# Patient Record
Sex: Female | Born: 1955 | Race: Black or African American | Hispanic: No | Smoking: Former smoker
Health system: Southern US, Community
[De-identification: ages and names within clinical notes are randomized; demographics above are authoritative.]

## PROBLEM LIST (undated history)

## (undated) ENCOUNTER — Emergency Department (HOSPITAL_COMMUNITY): Source: Home / Self Care

## (undated) DIAGNOSIS — J45909 Unspecified asthma, uncomplicated: Secondary | ICD-10-CM

## (undated) HISTORY — PX: FOOT SURGERY: SHX648

## (undated) HISTORY — PX: ABDOMINAL HYSTERECTOMY: SHX81

---

## 1998-08-04 ENCOUNTER — Observation Stay (HOSPITAL_COMMUNITY)
Admission: EM | Admit: 1998-08-04 | Discharge: 1998-08-06 | Payer: Self-pay | Source: Ambulatory Visit | Admitting: Emergency Medicine

## 1998-08-05 ENCOUNTER — Encounter: Payer: Self-pay | Admitting: Internal Medicine

## 2001-06-26 ENCOUNTER — Emergency Department (HOSPITAL_COMMUNITY): Admission: EM | Admit: 2001-06-26 | Discharge: 2001-06-27 | Payer: Self-pay | Admitting: Emergency Medicine

## 2002-12-23 ENCOUNTER — Emergency Department (HOSPITAL_COMMUNITY): Admission: EM | Admit: 2002-12-23 | Discharge: 2002-12-23 | Payer: Self-pay | Admitting: Emergency Medicine

## 2003-10-02 ENCOUNTER — Emergency Department (HOSPITAL_COMMUNITY): Admission: EM | Admit: 2003-10-02 | Discharge: 2003-10-02 | Payer: Self-pay | Admitting: Emergency Medicine

## 2008-01-11 ENCOUNTER — Encounter: Admission: RE | Admit: 2008-01-11 | Discharge: 2008-01-11 | Payer: Self-pay | Admitting: Gastroenterology

## 2009-02-14 ENCOUNTER — Encounter: Admission: RE | Admit: 2009-02-14 | Discharge: 2009-02-14 | Payer: Self-pay | Admitting: General Surgery

## 2009-02-17 ENCOUNTER — Ambulatory Visit (HOSPITAL_BASED_OUTPATIENT_CLINIC_OR_DEPARTMENT_OTHER): Admission: RE | Admit: 2009-02-17 | Discharge: 2009-02-17 | Payer: Self-pay | Admitting: General Surgery

## 2009-02-17 ENCOUNTER — Encounter (INDEPENDENT_AMBULATORY_CARE_PROVIDER_SITE_OTHER): Payer: Self-pay | Admitting: General Surgery

## 2010-06-29 ENCOUNTER — Emergency Department (HOSPITAL_COMMUNITY): Admission: EM | Admit: 2010-06-29 | Discharge: 2010-06-30 | Payer: Self-pay | Admitting: Emergency Medicine

## 2010-11-21 LAB — URINALYSIS, ROUTINE W REFLEX MICROSCOPIC
Bilirubin Urine: NEGATIVE
Glucose, UA: NEGATIVE mg/dL
Hgb urine dipstick: NEGATIVE
Ketones, ur: NEGATIVE mg/dL
Nitrite: NEGATIVE
Protein, ur: NEGATIVE mg/dL
Specific Gravity, Urine: 1.01 (ref 1.005–1.030)
Urobilinogen, UA: 0.2 mg/dL (ref 0.0–1.0)
pH: 6 (ref 5.0–8.0)

## 2010-12-17 LAB — BASIC METABOLIC PANEL
BUN: 9 mg/dL (ref 6–23)
CO2: 28 mEq/L (ref 19–32)
Calcium: 10.5 mg/dL (ref 8.4–10.5)
Chloride: 108 mEq/L (ref 96–112)
Creatinine, Ser: 0.73 mg/dL (ref 0.4–1.2)
GFR calc Af Amer: >60 mL/min (ref >60)
GFR calc non Af Amer: >60 mL/min (ref >60)
Glucose, Bld: 102 mg/dL — ABNORMAL HIGH (ref 70–99)
Potassium: 4 mEq/L (ref 3.5–5.1)
Sodium: 141 mEq/L (ref 135–145)

## 2010-12-17 LAB — DIFFERENTIAL
Basophils Absolute: 0 10*3/uL (ref 0.0–0.1)
Basophils Relative: 0 % (ref 0–1)
Eosinophils Absolute: 0.3 10*3/uL (ref 0.0–0.7)
Eosinophils Relative: 3 % (ref 0–5)
Lymphocytes Relative: 28 % (ref 12–46)
Lymphs Abs: 2.2 10*3/uL (ref 0.7–4.0)
Monocytes Absolute: 0.6 10*3/uL (ref 0.1–1.0)
Monocytes Relative: 8 % (ref 3–12)
Neutro Abs: 4.9 10*3/uL (ref 1.7–7.7)
Neutrophils Relative %: 61 % (ref 43–77)

## 2010-12-17 LAB — CBC
HCT: 35.8 % — ABNORMAL LOW (ref 36.0–46.0)
Hemoglobin: 11.6 g/dL — ABNORMAL LOW (ref 12.0–15.0)
MCHC: 32.3 g/dL (ref 30.0–36.0)
MCV: 71.4 fL — ABNORMAL LOW (ref 78.0–100.0)
Platelets: 370 10*3/uL (ref 150–400)
RBC: 5.01 MIL/uL (ref 3.87–5.11)
RDW: 15.1 % (ref 11.5–15.5)
WBC: 8 10*3/uL (ref 4.0–10.5)

## 2011-01-22 NOTE — Op Note (Signed)
Sandra Carr, Sandra Carr               ACCOUNT NO.:  [id number]   MEDICAL RECORD NO.:  [id number]          PATIENT TYPE:  AMB   LOCATION:  DSC                          FACILITY:  [facility]   PHYSICIAN:  Ollen Gross. Vernell Morgans, M.D. DATE OF BIRTH:  [birth year]   DATE OF PROCEDURE:  02/17/2009  DATE OF DISCHARGE:                               OPERATIVE REPORT   PREOPERATIVE DIAGNOSIS:  Cyst on the occipital scalp.   POSTOPERATIVE DIAGNOSIS:  Cyst on the occipital scalp.   PROCEDURE:  Excision of cyst from the scalp.   SURGEON:  Ollen Gross. Vernell Morgans, MD   ANESTHESIA:  General endotracheal.   PROCEDURE IN DETAIL:  After informed consent was obtained, the patient  was brought to the operating room, left in the supine position on a  stretcher.  After adequate induction of general anesthesia, the patient  was moved into a prone position on the operating room table and all  pressure points were padded.  Some of the hair was trimmed from her  posterior scalp overlying this large cyst.  The area in question was  then prepped with Betadine and draped in usual sterile manner.  The area  around the cyst was infiltrated with 0.25% Marcaine with epinephrine.  An elliptical incision of skin was made overlying the palpable mass.  This was oriented transversely.  The incision was carried through the  skin and into the subcutaneous tissue of the scalp sharply with a 15-  blade knife.  Once into the subcutaneous tissue, the cyst was excised  sharp by combination of blunt hemostat dissection and some sharp  dissection with electrocautery once the cyst was able to be maintained  intact without any rupture.  Once the cyst was completely removed, it  was sent to Pathology for further evaluation.  Hemostasis was achieved  using the Bovie electrocautery.  The wound was then closed with  interrupted 4-0 Monocryl subcuticular stitches.  A Dermabond dressing  was applied.  The patient tolerated the procedure well.  At the end  of  the case, all needle, sponge, and instrument counts were correct.  The  patient was then awaken and taken to the recovery room in stable  condition.      Ollen Gross. Vernell Morgans, M.D.  Electronically Signed     PST/MEDQ  D:  02/17/2009  T:  02/17/2009  Job:  [id number]

## 2014-07-01 ENCOUNTER — Ambulatory Visit
Admission: RE | Admit: 2014-07-01 | Discharge: 2014-07-01 | Disposition: A | Discharge status: Home / Self Care | Payer: 59 | Source: Ambulatory Visit | Attending: Family Medicine | Admitting: Family Medicine

## 2014-07-01 ENCOUNTER — Other Ambulatory Visit: Payer: Self-pay | Admitting: Family Medicine

## 2014-07-01 DIAGNOSIS — R0789 Other chest pain: Secondary | ICD-10-CM

## 2014-12-12 ENCOUNTER — Other Ambulatory Visit: Payer: Self-pay

## 2014-12-18 ENCOUNTER — Encounter (HOSPITAL_COMMUNITY): Payer: Self-pay | Admitting: Emergency Medicine

## 2014-12-18 ENCOUNTER — Emergency Department (HOSPITAL_COMMUNITY): Payer: 59

## 2014-12-18 ENCOUNTER — Emergency Department (HOSPITAL_COMMUNITY)
Admission: EM | Admit: 2014-12-18 | Discharge: 2014-12-18 | Disposition: A | Discharge status: Home / Self Care | Payer: 59 | Attending: Emergency Medicine | Admitting: Emergency Medicine

## 2014-12-18 DIAGNOSIS — M542 Cervicalgia: Secondary | ICD-10-CM | POA: Diagnosis present

## 2014-12-18 DIAGNOSIS — J45909 Unspecified asthma, uncomplicated: Secondary | ICD-10-CM | POA: Diagnosis not present

## 2014-12-18 DIAGNOSIS — Z7951 Long term (current) use of inhaled steroids: Secondary | ICD-10-CM | POA: Insufficient documentation

## 2014-12-18 DIAGNOSIS — Z79899 Other long term (current) drug therapy: Secondary | ICD-10-CM | POA: Diagnosis not present

## 2014-12-18 DIAGNOSIS — Z91040 Latex allergy status: Secondary | ICD-10-CM | POA: Insufficient documentation

## 2014-12-18 DIAGNOSIS — M791 Myalgia: Secondary | ICD-10-CM | POA: Insufficient documentation

## 2014-12-18 DIAGNOSIS — H9209 Otalgia, unspecified ear: Secondary | ICD-10-CM | POA: Insufficient documentation

## 2014-12-18 DIAGNOSIS — M436 Torticollis: Secondary | ICD-10-CM | POA: Insufficient documentation

## 2014-12-18 HISTORY — DX: Unspecified asthma, uncomplicated: J45.909

## 2014-12-18 MED ORDER — DEXAMETHASONE SODIUM PHOSPHATE 10 MG/ML IJ SOLN
5.0000 mg | Freq: Once | INTRAMUSCULAR | Status: AC
  Administered 2014-12-18: 5 mg via INTRAMUSCULAR
  Filled 2014-12-18: qty 1

## 2014-12-18 MED ORDER — KETOROLAC TROMETHAMINE 60 MG/2ML IM SOLN
60.0000 mg | Freq: Once | INTRAMUSCULAR | Status: AC
  Administered 2014-12-18: 60 mg via INTRAMUSCULAR
  Filled 2014-12-18: qty 2

## 2014-12-18 MED ORDER — METHOCARBAMOL 1000 MG/10ML IJ SOLN
500.0000 mg | Freq: Once | INTRAMUSCULAR | Status: AC
  Administered 2014-12-18: 500 mg via INTRAMUSCULAR
  Filled 2014-12-18: qty 5

## 2014-12-18 MED ORDER — METHOCARBAMOL 500 MG PO TABS: 500.0000 mg | ORAL_TABLET | Freq: Two times a day (BID) | ORAL | Status: DC

## 2014-12-18 MED ORDER — DIAZEPAM 5 MG/ML IJ SOLN
5.0000 mg | Freq: Once | INTRAMUSCULAR | Status: DC
  Filled 2014-12-18: qty 2

## 2014-12-18 NOTE — Discharge Instructions (Signed)
Return to the emergency room with worsening of symptoms, new symptoms or with symptoms that are concerning, , especially severe worsening of headache, visual or speech changes, weakness in face, arms or legs. RICE: Rest, Ice (three cycles of 20 mins on, 68mins off at least twice a day), compression/brace, elevation. Heating pad works well for back pain. Ibuprofen 400mg  (2 tablets 200mg ) every 5-6 hours for 3-5 days. Robaxin for severe pain. Do not operate machinery, drive or drink alcohol while taking narcotics or muscle relaxers. Call to make appointment as soon as possible for follow up with orthopedist. Follow up with PCP if symptoms worsen or are persistent. Read below information and follow recommendations. Torticollis, Acute You have suddenly (acutely) developed a twisted neck (torticollis). This is usually a self-limited condition. CAUSES  Acute torticollis may be caused by malposition, trauma or infection. Most commonly, acute torticollis is caused by sleeping in an awkward position. Torticollis may also be caused by the flexion, extension or twisting of the neck muscles beyond their normal position. Sometimes, the exact cause may not be known. SYMPTOMS  Usually, there is pain and limited movement of the neck. Your neck may twist to one side. DIAGNOSIS  The diagnosis is often made by physical examination. X-rays, CT scans or MRIs may be done if there is a history of trauma or concern of infection. TREATMENT  For a common, stiff neck that develops during sleep, treatment is focused on relaxing the contracted neck muscle. Medications (including shots) may be used to treat the problem. Most cases resolve in several days. Torticollis usually responds to conservative physical therapy. If left untreated, the shortened and spastic neck muscle can cause deformities in the face and neck. Rarely, surgery is required. HOME CARE INSTRUCTIONS   Use over-the-counter and prescription medications as  directed by your caregiver.  Do stretching exercises and massage the neck as directed by your caregiver.  Follow up with physical therapy if needed and as directed by your caregiver. SEEK IMMEDIATE MEDICAL CARE IF:   You develop difficulty breathing or noisy breathing (stridor).  You drool, develop trouble swallowing or have pain with swallowing.  You develop numbness or weakness in the hands or feet.  You have changes in speech or vision.  You have problems with urination or bowel movements.  You have difficulty walking.  You have a fever.  You have increased pain. MAKE SURE YOU:   Understand these instructions.  Will watch your condition.  Will get help right away if you are not doing well or get worse. Document Released: 08/23/2000 Document Revised: 11/18/2011 Document Reviewed: 10/04/2009 [facility] Patient Information 2015 [place], [place]. This information is not intended to replace advice given to you by your health care provider. Make sure you discuss any questions you have with your health care provider.

## 2014-12-18 NOTE — ED Notes (Addendum)
Pt states that she was looking through some papers on Friday and reached to put some on table to behind her and felt spasm in her neck and side of her face.  Pt states that spasms have gotten worse and all the way across her neck now.  Pt states that she took an ibuprofen this morning before church and helped a little bit then around 330pm she took an old flexeril which hasnt really helped the muscle pain.

## 2014-12-18 NOTE — ED Provider Notes (Signed)
CSN: [id number]     Arrival date & time 12/18/14  1728 History  This chart was scribed for non-physician provider Al Corpus, PA-C, working with Ernestina Patches, MD by Irene Pap, ED Scribe. This patient was seen in room WTR5/WTR5 and patient care was started at 5:55 PM.    Chief Complaint  Patient presents with  . Neck Pain  . Facial Pain   Patient is a 59 y.o. female presenting with neck pain. The history is provided by the patient. No language interpreter was used.  Neck Pain Associated symptoms: headaches   Associated symptoms: no fever, no numbness and no weakness    HPI Comments: Sandra Carr is a 59 y.o. female who presents to the Emergency Department complaining of bilateral neck and facial spasms with pain onset 2 days ago. She states that she was reaching for papers on Friday night when she felt pain in her neck that has been constant since. R worse then left. Patient reports that the pain now radiates across her neck and the spasms have worsened. She states that she has associated headache, and ear pain. Patient reports intermittent shooting pain down her arms, but is not frequent. She states that she took ibuprofen this morning to some relief and Flexeril 2 hours ago with no relief. She denies fever, visual changes, nausea, vomiting, numbness, weakness or tingling. Patient wears corrective lenses. No chest pain shortness of breath.   Past Medical History  Diagnosis Date  . Asthma    History reviewed. No pertinent past surgical history. No family history on file. History  Substance Use Topics  . Smoking status: Never Smoker   . Smokeless tobacco: Not on file  . Alcohol Use: No   OB History    No data available     Review of Systems  Constitutional: Negative for fever.  HENT: Positive for ear pain and trouble swallowing.   Eyes: Positive for pain. Negative for visual disturbance.  Musculoskeletal: Positive for myalgias and neck pain.  Neurological: Positive for  headaches. Negative for weakness and numbness.   Allergies  Peanut-containing drug products; Iohexol; Latex; and Shellfish allergy  Home Medications   Prior to Admission medications   Medication Sig Start Date End Date Taking? Authorizing Provider  albuterol (PROVENTIL HFA;VENTOLIN HFA) 108 (90 BASE) MCG/ACT inhaler Inhale 2 puffs into the lungs every 6 (six) hours as needed for wheezing or shortness of breath (wheezing).   Yes Historical Provider, MD  albuterol (PROVENTIL) (2.5 MG/3ML) 0.083% nebulizer solution Take 2.5 mg by nebulization every 6 (six) hours as needed for wheezing or shortness of breath (wheezing).   Yes Historical Provider, MD  amLODipine (NORVASC) 5 MG tablet Take 5 mg by mouth daily.   Yes Historical Provider, MD  cetirizine (ZYRTEC) 10 MG tablet Take 10 mg by mouth daily.   Yes Historical Provider, MD  mometasone-formoterol (DULERA) 100-5 MCG/ACT AERO Inhale 2 puffs into the lungs 2 (two) times daily.   Yes Historical Provider, MD  Multiple Vitamins-Minerals (MULTIVITAMIN & MINERAL PO) Take 1 tablet by mouth daily.   Yes Historical Provider, MD  zolpidem (AMBIEN CR) 12.5 MG CR tablet Take 6.25 mg by mouth at bedtime as needed for sleep (sleep).   Yes Historical Provider, MD  methocarbamol (ROBAXIN) 500 MG tablet Take 1 tablet (500 mg total) by mouth 2 (two) times daily. 12/18/14   Al Corpus, PA-C   BP 162/84 mmHg  Pulse 101  Temp(Src) 97.8 F (36.6 C) (Oral)  Resp 18  Ht 5'  4" (1.626 m)  Wt 156 lb (70.761 kg)  BMI 26.76 kg/m2  SpO2 100%  LMP 05/18/1993  Physical Exam  Constitutional: She appears well-developed and well-nourished. No distress.  HENT:  Head: Normocephalic and atraumatic.  Bilateral normal TMs  Eyes: Conjunctivae are normal. Right eye exhibits no discharge. Left eye exhibits no discharge.  Neck:  Significant muscle tightness bilaterally in sternocleidomastoid Neck: 45 range of motion bilaterally no meningismus. No stridor   Pulmonary/Chest: Effort normal. No respiratory distress.  Musculoskeletal:  No midline C-Spine bony tenderness  Neurological: She is alert. Coordination normal.  Skin: She is not diaphoretic.  Psychiatric: She has a normal mood and affect. Her behavior is normal.  Nursing note and vitals reviewed.   ED Course  Procedures (including critical care time) DIAGNOSTIC STUDIES: Oxygen Saturation is 100% on room air, normal by my interpretation.    COORDINATION OF CARE: 6:00 PM-Discussed treatment plan which includes muscle relaxer C-Spine x-ray with pt at bedside and pt agreed to plan.   Labs Review Labs Reviewed - No data to display  Imaging Review Dg Cervical Spine Complete  12/18/2014   CLINICAL DATA:  Bilateral neck pain for 2 days  EXAM: CERVICAL SPINE  4+ VIEWS  COMPARISON:  None.  FINDINGS: Normal alignment of the cervical spine. The vertebral body heights are well preserved. There is disc space narrowing and ventral endplate spurring at L4-6, C5-6 and C6-7. The facet joints are well-aligned. The neural foramina appear patent. No acute fracture or subluxation identified.  IMPRESSION: 1. No acute findings. 2. Cervical spondylosis.   Electronically Signed   By: Kerby Moors M.D.   On: 12/18/2014 19:06     EKG Interpretation None      MDM   Final diagnoses:  Torticollis, acute   Patient's pain managed in the ED. Patient with likely torticollis. X-ray without acute findings patient does have joint space narrowing at C4-7. Good alignment. Patient given prescription for Robaxin and discussed rice protocol as well as gentle stretching. Discussed strict return precautions and patient to follow-up with orthopedics.  Discussed return precautions with patient. Discussed all results and patient verbalizes understanding and agrees with plan.  I personally performed the services described in this documentation, which was scribed in my presence. The recorded information has been reviewed  and is accurate.   Al Corpus, PA-C 12/18/14 1954  Ernestina Patches, MD 12/19/14 [phone]

## 2016-01-25 ENCOUNTER — Ambulatory Visit (INDEPENDENT_AMBULATORY_CARE_PROVIDER_SITE_OTHER): Payer: 59

## 2016-01-25 DIAGNOSIS — R000 Tachycardia, unspecified: Secondary | ICD-10-CM | POA: Insufficient documentation

## 2016-09-30 DIAGNOSIS — I10 Essential (primary) hypertension: Secondary | ICD-10-CM | POA: Diagnosis not present

## 2016-09-30 DIAGNOSIS — R21 Rash and other nonspecific skin eruption: Secondary | ICD-10-CM | POA: Diagnosis not present

## 2016-10-04 DIAGNOSIS — R05 Cough: Secondary | ICD-10-CM | POA: Diagnosis not present

## 2016-10-28 DIAGNOSIS — I10 Essential (primary) hypertension: Secondary | ICD-10-CM | POA: Diagnosis not present

## 2016-10-28 DIAGNOSIS — Z8249 Family history of ischemic heart disease and other diseases of the circulatory system: Secondary | ICD-10-CM | POA: Diagnosis not present

## 2016-11-27 DIAGNOSIS — J4541 Moderate persistent asthma with (acute) exacerbation: Secondary | ICD-10-CM | POA: Diagnosis not present

## 2016-12-09 DIAGNOSIS — Z01419 Encounter for gynecological examination (general) (routine) without abnormal findings: Secondary | ICD-10-CM | POA: Diagnosis not present

## 2016-12-09 DIAGNOSIS — Z6824 Body mass index (BMI) 24.0-24.9, adult: Secondary | ICD-10-CM | POA: Diagnosis not present

## 2016-12-16 DIAGNOSIS — I10 Essential (primary) hypertension: Secondary | ICD-10-CM | POA: Diagnosis not present

## 2016-12-16 DIAGNOSIS — J45901 Unspecified asthma with (acute) exacerbation: Secondary | ICD-10-CM | POA: Diagnosis not present

## 2016-12-18 DIAGNOSIS — R05 Cough: Secondary | ICD-10-CM | POA: Diagnosis not present

## 2016-12-18 DIAGNOSIS — J209 Acute bronchitis, unspecified: Secondary | ICD-10-CM | POA: Diagnosis not present

## 2016-12-30 DIAGNOSIS — J209 Acute bronchitis, unspecified: Secondary | ICD-10-CM | POA: Diagnosis not present

## 2016-12-30 DIAGNOSIS — I10 Essential (primary) hypertension: Secondary | ICD-10-CM | POA: Diagnosis not present

## 2016-12-30 DIAGNOSIS — R739 Hyperglycemia, unspecified: Secondary | ICD-10-CM | POA: Diagnosis not present

## 2016-12-30 DIAGNOSIS — Z20828 Contact with and (suspected) exposure to other viral communicable diseases: Secondary | ICD-10-CM | POA: Diagnosis not present

## 2017-01-13 DIAGNOSIS — I10 Essential (primary) hypertension: Secondary | ICD-10-CM | POA: Diagnosis not present

## 2017-01-13 DIAGNOSIS — E559 Vitamin D deficiency, unspecified: Secondary | ICD-10-CM | POA: Diagnosis not present

## 2017-01-13 DIAGNOSIS — J45909 Unspecified asthma, uncomplicated: Secondary | ICD-10-CM | POA: Diagnosis not present

## 2017-01-23 DIAGNOSIS — H5213 Myopia, bilateral: Secondary | ICD-10-CM | POA: Diagnosis not present

## 2017-02-10 ENCOUNTER — Ambulatory Visit (HOSPITAL_COMMUNITY)
Admission: RE | Admit: 2017-02-10 | Discharge: 2017-02-10 | Disposition: A | Discharge status: Home / Self Care | Payer: 59 | Source: Ambulatory Visit | Attending: Family Medicine | Admitting: Family Medicine

## 2017-02-10 ENCOUNTER — Other Ambulatory Visit (HOSPITAL_COMMUNITY): Payer: Self-pay | Admitting: Family Medicine

## 2017-02-10 ENCOUNTER — Encounter (INDEPENDENT_AMBULATORY_CARE_PROVIDER_SITE_OTHER): Payer: Self-pay

## 2017-02-10 DIAGNOSIS — M79661 Pain in right lower leg: Secondary | ICD-10-CM | POA: Diagnosis not present

## 2017-02-10 DIAGNOSIS — R7303 Prediabetes: Secondary | ICD-10-CM | POA: Diagnosis not present

## 2017-02-10 DIAGNOSIS — M7989 Other specified soft tissue disorders: Secondary | ICD-10-CM | POA: Diagnosis not present

## 2017-02-10 DIAGNOSIS — R609 Edema, unspecified: Secondary | ICD-10-CM | POA: Diagnosis not present

## 2017-02-10 DIAGNOSIS — Z0000 Encounter for general adult medical examination without abnormal findings: Secondary | ICD-10-CM | POA: Diagnosis not present

## 2017-02-10 DIAGNOSIS — I10 Essential (primary) hypertension: Secondary | ICD-10-CM | POA: Diagnosis not present

## 2017-02-10 DIAGNOSIS — Z13220 Encounter for screening for lipoid disorders: Secondary | ICD-10-CM | POA: Diagnosis not present

## 2017-02-10 DIAGNOSIS — J45901 Unspecified asthma with (acute) exacerbation: Secondary | ICD-10-CM | POA: Diagnosis not present

## 2017-02-10 NOTE — Progress Notes (Signed)
VASCULAR LAB PRELIMINARY  PRELIMINARY  PRELIMINARY  PRELIMINARY  Right lower extremity venous duplex completed.    Preliminary report: Right:  No evidence of DVT, superficial thrombosis, or Baker's cyst.  Zuleica Seith, RVS 02/10/2017, 11:04 AM

## 2017-05-15 DIAGNOSIS — R7303 Prediabetes: Secondary | ICD-10-CM | POA: Diagnosis not present

## 2017-05-15 DIAGNOSIS — M1711 Unilateral primary osteoarthritis, right knee: Secondary | ICD-10-CM | POA: Diagnosis not present

## 2017-05-15 DIAGNOSIS — E559 Vitamin D deficiency, unspecified: Secondary | ICD-10-CM | POA: Diagnosis not present

## 2017-05-15 DIAGNOSIS — I10 Essential (primary) hypertension: Secondary | ICD-10-CM | POA: Diagnosis not present

## 2017-08-14 DIAGNOSIS — R7303 Prediabetes: Secondary | ICD-10-CM | POA: Diagnosis not present

## 2017-08-14 DIAGNOSIS — I10 Essential (primary) hypertension: Secondary | ICD-10-CM | POA: Diagnosis not present

## 2017-08-14 DIAGNOSIS — E559 Vitamin D deficiency, unspecified: Secondary | ICD-10-CM | POA: Diagnosis not present

## 2017-08-14 DIAGNOSIS — R002 Palpitations: Secondary | ICD-10-CM | POA: Diagnosis not present

## 2017-09-12 DIAGNOSIS — M25512 Pain in left shoulder: Secondary | ICD-10-CM | POA: Diagnosis not present

## 2017-09-12 DIAGNOSIS — M25532 Pain in left wrist: Secondary | ICD-10-CM | POA: Diagnosis not present

## 2017-09-12 DIAGNOSIS — M5412 Radiculopathy, cervical region: Secondary | ICD-10-CM | POA: Diagnosis not present

## 2017-09-25 DIAGNOSIS — I10 Essential (primary) hypertension: Secondary | ICD-10-CM | POA: Diagnosis not present

## 2017-09-25 DIAGNOSIS — Z8249 Family history of ischemic heart disease and other diseases of the circulatory system: Secondary | ICD-10-CM | POA: Diagnosis not present

## 2017-09-25 DIAGNOSIS — R002 Palpitations: Secondary | ICD-10-CM | POA: Diagnosis not present

## 2017-09-26 DIAGNOSIS — M5412 Radiculopathy, cervical region: Secondary | ICD-10-CM | POA: Diagnosis not present

## 2017-09-26 DIAGNOSIS — M25532 Pain in left wrist: Secondary | ICD-10-CM | POA: Diagnosis not present

## 2017-09-26 DIAGNOSIS — M25512 Pain in left shoulder: Secondary | ICD-10-CM | POA: Diagnosis not present

## 2017-10-02 DIAGNOSIS — H9193 Unspecified hearing loss, bilateral: Secondary | ICD-10-CM | POA: Diagnosis not present

## 2017-10-02 DIAGNOSIS — H9313 Tinnitus, bilateral: Secondary | ICD-10-CM | POA: Diagnosis not present

## 2017-10-02 DIAGNOSIS — R7303 Prediabetes: Secondary | ICD-10-CM | POA: Diagnosis not present

## 2017-10-06 DIAGNOSIS — R002 Palpitations: Secondary | ICD-10-CM | POA: Diagnosis not present

## 2017-10-06 DIAGNOSIS — R011 Cardiac murmur, unspecified: Secondary | ICD-10-CM | POA: Diagnosis not present

## 2017-10-09 DIAGNOSIS — Z0110 Encounter for examination of ears and hearing without abnormal findings: Secondary | ICD-10-CM | POA: Diagnosis not present

## 2017-10-09 DIAGNOSIS — H9313 Tinnitus, bilateral: Secondary | ICD-10-CM | POA: Diagnosis not present

## 2017-12-15 DIAGNOSIS — E559 Vitamin D deficiency, unspecified: Secondary | ICD-10-CM | POA: Diagnosis not present

## 2017-12-15 DIAGNOSIS — I10 Essential (primary) hypertension: Secondary | ICD-10-CM | POA: Diagnosis not present

## 2017-12-15 DIAGNOSIS — R7303 Prediabetes: Secondary | ICD-10-CM | POA: Diagnosis not present

## 2017-12-18 DIAGNOSIS — E559 Vitamin D deficiency, unspecified: Secondary | ICD-10-CM | POA: Diagnosis not present

## 2017-12-18 DIAGNOSIS — E210 Primary hyperparathyroidism: Secondary | ICD-10-CM | POA: Diagnosis not present

## 2017-12-18 DIAGNOSIS — I10 Essential (primary) hypertension: Secondary | ICD-10-CM | POA: Diagnosis not present

## 2017-12-29 DIAGNOSIS — Z01419 Encounter for gynecological examination (general) (routine) without abnormal findings: Secondary | ICD-10-CM | POA: Diagnosis not present

## 2017-12-30 ENCOUNTER — Other Ambulatory Visit: Payer: Self-pay | Admitting: Obstetrics and Gynecology

## 2017-12-30 DIAGNOSIS — R928 Other abnormal and inconclusive findings on diagnostic imaging of breast: Secondary | ICD-10-CM

## 2018-01-02 ENCOUNTER — Ambulatory Visit: Payer: 59

## 2018-01-02 ENCOUNTER — Ambulatory Visit
Admission: RE | Admit: 2018-01-02 | Discharge: 2018-01-02 | Disposition: A | Discharge status: Home / Self Care | Payer: 59 | Source: Ambulatory Visit | Attending: Obstetrics and Gynecology | Admitting: Obstetrics and Gynecology

## 2018-01-02 DIAGNOSIS — R928 Other abnormal and inconclusive findings on diagnostic imaging of breast: Secondary | ICD-10-CM | POA: Diagnosis not present

## 2018-01-30 DIAGNOSIS — H524 Presbyopia: Secondary | ICD-10-CM | POA: Diagnosis not present

## 2018-02-16 DIAGNOSIS — Z0000 Encounter for general adult medical examination without abnormal findings: Secondary | ICD-10-CM | POA: Diagnosis not present

## 2018-02-19 DIAGNOSIS — I10 Essential (primary) hypertension: Secondary | ICD-10-CM | POA: Diagnosis not present

## 2018-03-06 DIAGNOSIS — D126 Benign neoplasm of colon, unspecified: Secondary | ICD-10-CM | POA: Diagnosis not present

## 2018-03-06 DIAGNOSIS — Z1211 Encounter for screening for malignant neoplasm of colon: Secondary | ICD-10-CM | POA: Diagnosis not present

## 2018-07-22 DIAGNOSIS — R05 Cough: Secondary | ICD-10-CM | POA: Diagnosis not present

## 2018-08-24 DIAGNOSIS — R7303 Prediabetes: Secondary | ICD-10-CM | POA: Diagnosis not present

## 2018-08-24 DIAGNOSIS — I10 Essential (primary) hypertension: Secondary | ICD-10-CM | POA: Diagnosis not present

## 2018-08-24 DIAGNOSIS — E559 Vitamin D deficiency, unspecified: Secondary | ICD-10-CM | POA: Diagnosis not present

## 2018-12-31 DIAGNOSIS — Z01419 Encounter for gynecological examination (general) (routine) without abnormal findings: Secondary | ICD-10-CM | POA: Diagnosis not present

## 2018-12-31 DIAGNOSIS — Z130 Encounter for screening for diseases of the blood and blood-forming organs and certain disorders involving the immune mechanism: Secondary | ICD-10-CM | POA: Diagnosis not present

## 2018-12-31 DIAGNOSIS — Z6825 Body mass index (BMI) 25.0-25.9, adult: Secondary | ICD-10-CM | POA: Diagnosis not present

## 2019-02-28 ENCOUNTER — Other Ambulatory Visit: Payer: Self-pay | Admitting: *Deleted

## 2019-02-28 DIAGNOSIS — Z20822 Contact with and (suspected) exposure to covid-19: Secondary | ICD-10-CM

## 2019-03-08 LAB — NOVEL CORONAVIRUS, NAA: SARS-CoV-2, NAA: NOT DETECTED

## 2019-03-31 ENCOUNTER — Other Ambulatory Visit: Payer: Self-pay

## 2019-03-31 DIAGNOSIS — Z20822 Contact with and (suspected) exposure to covid-19: Secondary | ICD-10-CM

## 2019-04-04 LAB — NOVEL CORONAVIRUS, NAA: SARS-CoV-2, NAA: NOT DETECTED

## 2019-04-07 ENCOUNTER — Telehealth: Payer: Self-pay | Admitting: Family Medicine

## 2019-04-07 NOTE — Telephone Encounter (Signed)
Pt called back, made her aware COVID results are negative.  Copied from [place] [phone]. Topic: Quick Communication - Lab Results (Clinic Use ONLY) >> Apr 07, 2019  3:48 PM Marin Roberts, [place] wrote: Called patient to inform them of [place] lab results. When patient returns call, triage nurse may disclose results.

## 2019-08-23 ENCOUNTER — Other Ambulatory Visit: Payer: Self-pay

## 2019-08-23 DIAGNOSIS — Z20822 Contact with and (suspected) exposure to covid-19: Secondary | ICD-10-CM

## 2019-08-24 LAB — NOVEL CORONAVIRUS, NAA: SARS-CoV-2, NAA: NOT DETECTED

## 2019-08-25 ENCOUNTER — Telehealth: Payer: Self-pay | Admitting: Family Medicine

## 2019-08-25 NOTE — Telephone Encounter (Signed)
Negative COVID results given. Patient results "NOT Detected." Caller expressed understanding. ° °

## 2019-09-20 ENCOUNTER — Other Ambulatory Visit: Payer: Self-pay

## 2019-09-20 ENCOUNTER — Encounter (HOSPITAL_COMMUNITY): Payer: Self-pay | Admitting: Emergency Medicine

## 2019-09-20 ENCOUNTER — Emergency Department (HOSPITAL_COMMUNITY)
Admission: EM | Admit: 2019-09-20 | Discharge: 2019-09-20 | Disposition: A | Discharge status: Home / Self Care | Payer: Medicare Other | Attending: Emergency Medicine | Admitting: Emergency Medicine

## 2019-09-20 DIAGNOSIS — Z91040 Latex allergy status: Secondary | ICD-10-CM | POA: Diagnosis not present

## 2019-09-20 DIAGNOSIS — R112 Nausea with vomiting, unspecified: Secondary | ICD-10-CM | POA: Insufficient documentation

## 2019-09-20 DIAGNOSIS — Z91010 Allergy to peanuts: Secondary | ICD-10-CM | POA: Insufficient documentation

## 2019-09-20 DIAGNOSIS — M62838 Other muscle spasm: Secondary | ICD-10-CM | POA: Insufficient documentation

## 2019-09-20 DIAGNOSIS — Z79899 Other long term (current) drug therapy: Secondary | ICD-10-CM | POA: Insufficient documentation

## 2019-09-20 DIAGNOSIS — D509 Iron deficiency anemia, unspecified: Secondary | ICD-10-CM

## 2019-09-20 DIAGNOSIS — M542 Cervicalgia: Secondary | ICD-10-CM | POA: Diagnosis not present

## 2019-09-20 DIAGNOSIS — J45909 Unspecified asthma, uncomplicated: Secondary | ICD-10-CM | POA: Diagnosis not present

## 2019-09-20 DIAGNOSIS — M25511 Pain in right shoulder: Secondary | ICD-10-CM | POA: Diagnosis present

## 2019-09-20 LAB — CBC WITH DIFFERENTIAL/PLATELET
Abs Immature Granulocytes: 0.03 10*3/uL (ref 0.00–0.07)
Basophils Absolute: 0.1 10*3/uL (ref 0.0–0.1)
Basophils Relative: 1 %
Eosinophils Absolute: 0.4 10*3/uL (ref 0.0–0.5)
Eosinophils Relative: 4 %
HCT: 35.3 % — ABNORMAL LOW (ref 36.0–46.0)
Hemoglobin: 11.1 g/dL — ABNORMAL LOW (ref 12.0–15.0)
Immature Granulocytes: 0 %
Lymphocytes Relative: 30 %
Lymphs Abs: 3 10*3/uL (ref 0.7–4.0)
MCH: 21.8 pg — ABNORMAL LOW (ref 26.0–34.0)
MCHC: 31.4 g/dL (ref 30.0–36.0)
MCV: 69.4 fL — ABNORMAL LOW (ref 80.0–100.0)
Monocytes Absolute: 0.9 10*3/uL (ref 0.1–1.0)
Monocytes Relative: 9 %
Neutro Abs: 5.7 10*3/uL (ref 1.7–7.7)
Neutrophils Relative %: 56 %
Platelets: 536 10*3/uL — ABNORMAL HIGH (ref 150–400)
RBC: 5.09 MIL/uL (ref 3.87–5.11)
RDW: 19.1 % — ABNORMAL HIGH (ref 11.5–15.5)
WBC: 10.1 10*3/uL (ref 4.0–10.5)
nRBC: 0 % (ref 0.0–0.2)

## 2019-09-20 LAB — BASIC METABOLIC PANEL
Anion gap: 11 (ref 5–15)
BUN: 7 mg/dL — ABNORMAL LOW (ref 8–23)
CO2: 23 mmol/L (ref 22–32)
Calcium: 9.4 mg/dL (ref 8.9–10.3)
Chloride: 106 mmol/L (ref 98–111)
Creatinine, Ser: 0.78 mg/dL (ref 0.44–1.00)
GFR calc Af Amer: >60 mL/min (ref >60)
GFR calc non Af Amer: >60 mL/min (ref >60)
Glucose, Bld: 126 mg/dL — ABNORMAL HIGH (ref 70–99)
Potassium: 3.8 mmol/L (ref 3.5–5.1)
Sodium: 140 mmol/L (ref 135–145)

## 2019-09-20 LAB — TROPONIN I (HIGH SENSITIVITY): Troponin I (High Sensitivity): <2 ng/L (ref <18)

## 2019-09-20 LAB — MAGNESIUM: Magnesium: 1.9 mg/dL (ref 1.7–2.4)

## 2019-09-20 MED ORDER — METHOCARBAMOL 1000 MG/10ML IJ SOLN
1000.0000 mg | Freq: Once | INTRAMUSCULAR | Status: AC
  Administered 2019-09-20: 05:00:00 1000 mg via INTRAVENOUS
  Filled 2019-09-20: qty 10

## 2019-09-20 MED ORDER — KETOROLAC TROMETHAMINE 30 MG/ML IJ SOLN
15.0000 mg | Freq: Once | INTRAMUSCULAR | Status: AC
  Administered 2019-09-20: 04:00:00 15 mg via INTRAVENOUS
  Filled 2019-09-20: qty 1

## 2019-09-20 MED ORDER — NAPROXEN 375 MG PO TABS: 375.0000 mg | ORAL_TABLET | Freq: Two times a day (BID) | ORAL | 0 refills | Status: DC

## 2019-09-20 MED ORDER — ONDANSETRON HCL 4 MG/2ML IJ SOLN
4.0000 mg | Freq: Once | INTRAMUSCULAR | Status: AC
  Administered 2019-09-20: 4 mg via INTRAVENOUS
  Filled 2019-09-20: qty 2

## 2019-09-20 MED ORDER — MORPHINE SULFATE (PF) 4 MG/ML IV SOLN
4.0000 mg | Freq: Once | INTRAVENOUS | Status: AC
  Administered 2019-09-20: 4 mg via INTRAVENOUS
  Filled 2019-09-20: qty 1

## 2019-09-20 MED ORDER — ORPHENADRINE CITRATE ER 100 MG PO TB12: 100.0000 mg | ORAL_TABLET | Freq: Two times a day (BID) | ORAL | 0 refills | Status: DC

## 2019-09-20 MED ORDER — OXYCODONE-ACETAMINOPHEN 5-325 MG PO TABS: 1.0000 | ORAL_TABLET | ORAL | 0 refills | Status: DC | PRN

## 2019-09-20 NOTE — ED Provider Notes (Signed)
[facility] EMERGENCY DEPARTMENT Provider Note   CSN: [id number] Arrival date & time: 09/20/19  [id number]   History Chief Complaint  Patient presents with  . Shoulder Pain    Sandra Carr is a 64 y.o. female.  The history is provided by the patient.  Shoulder Pain He has history of asthma and hypertension and noted onset about 10 PM pain and muscle spasm in the right side of her neck and shoulder.  She has been unable to get comfortable.  Every time she moves, she feels the muscles grabbing her.  She currently rates pain at 10/10.  There has been some nausea and she has vomited once.  She denies dyspnea.  She denies doing any unusual activity.  She has never had anything like this before.  She did try taking a potassium supplement which did not seem to give any relief.  Past Medical History:  Diagnosis Date  . Asthma     Patient Active Problem List   Diagnosis Date Noted  . Tachycardia 01/25/2016    No past surgical history on file.   OB History   No obstetric history on file.     Family History  Problem Relation Age of Onset  . Breast cancer Cousin     Social History   Tobacco Use  . Smoking status: Never Smoker  Substance Use Topics  . Alcohol use: No  . Drug use: Not on file    Home Medications Prior to Admission medications   Medication Sig Start Date End Date Taking? Authorizing Provider  albuterol (PROVENTIL HFA;VENTOLIN HFA) 108 (90 BASE) MCG/ACT inhaler Inhale 2 puffs into the lungs every 6 (six) hours as needed for wheezing or shortness of breath (wheezing).    [provider]  albuterol (PROVENTIL) (2.5 MG/3ML) 0.083% nebulizer solution Take 2.5 mg by nebulization every 6 (six) hours as needed for wheezing or shortness of breath (wheezing).    [provider]  amLODipine (NORVASC) 5 MG tablet Take 5 mg by mouth daily.    [provider]  cetirizine (ZYRTEC) 10 MG tablet Take 10 mg by mouth daily.    [provider]  methocarbamol (ROBAXIN) 500 MG tablet Take 1 tablet (500 mg total) by mouth 2 (two) times daily. 12/18/14   Al Corpus, PA-C  mometasone-formoterol (DULERA) 100-5 MCG/ACT AERO Inhale 2 puffs into the lungs 2 (two) times daily.    [provider]  Multiple Vitamins-Minerals (MULTIVITAMIN & MINERAL PO) Take 1 tablet by mouth daily.    [provider]  zolpidem (AMBIEN CR) 12.5 MG CR tablet Take 6.25 mg by mouth at bedtime as needed for sleep (sleep).    [provider]    Allergies    Peanut-containing drug products, Iohexol, Latex, and Shellfish allergy  Review of Systems   Review of Systems  All other systems reviewed and are negative.   Physical Exam Updated Vital Signs BP 133/85 (BP Location: Left Arm)   Pulse 97   Temp 98.4 F (36.9 C) (Oral)   Resp (!) 30   LMP 05/18/1993   SpO2 100%   Physical Exam Vitals and nursing note reviewed.   64 year old female, appears uncomfortable, but is in no acute distress. Vital signs are significant for rapid respiratory rate. Oxygen saturation is 100%, which is normal. Head is normocephalic and atraumatic. PERRLA, EOMI. Oropharynx is clear. Neck is nontender and supple without adenopathy or JVD. Back is nontender and there is no CVA tenderness. Lungs  are clear without rales, wheezes, or rhonchi. Chest is nontender. Heart has regular rate and rhythm without murmur. Abdomen is soft, flat, nontender without masses or hepatosplenomegaly and peristalsis is normoactive. Extremities: There is significant tenderness palpation over the right deltoid muscle.  There is pain on passive movement of the right shoulder but there is no tenderness over the shoulder joint itself.  There is no warmth or erythema.  Distal neurovascular exam is intact. Skin is warm and dry without rash. Neurologic: Mental status is normal, cranial nerves are intact, there are no motor or sensory deficits.  ED Results /  Procedures / Treatments   Labs (all labs ordered are listed, but only abnormal results are displayed) Labs Reviewed  BASIC METABOLIC PANEL - Abnormal; Notable for the following components:      Result Value   Glucose, Bld 126 (*)    BUN 7 (*)    All other components within normal limits  CBC WITH DIFFERENTIAL/PLATELET - Abnormal; Notable for the following components:   Hemoglobin 11.1 (*)    HCT 35.3 (*)    MCV 69.4 (*)    MCH 21.8 (*)    RDW 19.1 (*)    Platelets 536 (*)    All other components within normal limits  MAGNESIUM  TROPONIN I (HIGH SENSITIVITY)  TROPONIN I (HIGH SENSITIVITY)    EKG EKG Interpretation  Date/Time:  Monday September 20 2019 06:09:23 EST Ventricular Rate:  79 PR Interval:    QRS Duration: 92 QT Interval:  372 QTC Calculation: 427 R Axis:   -22 Text Interpretation: Sinus rhythm Borderline left axis deviation Borderline T wave abnormalities When compared with ECG of 02/14/2009, No significant change was found Confirmed by Delora Fuel ([id number]) on 09/20/2019 6:10:36 AM   Procedures Procedures   Medications Ordered in ED Medications  methocarbamol (ROBAXIN) 1,000 mg in dextrose 5 % 100 mL IVPB (0 mg Intravenous Stopped 09/20/19 0544)  ondansetron (ZOFRAN) injection 4 mg (4 mg Intravenous Given 09/20/19 0414)  ketorolac (TORADOL) 30 MG/ML injection 15 mg (15 mg Intravenous Given 09/20/19 0414)  morphine 4 MG/ML injection 4 mg (4 mg Intravenous Given 09/20/19 0414)    ED Course  I have reviewed the triage vital signs and the nursing notes.  Pertinent labs & imaging results that were available during my care of the patient were reviewed by me and considered in my medical decision making (see chart for details).  MDM Rules/Calculators/A&P Muscle spasms in the right shoulder.  Will check ECG to make sure there is no a cardiac etiology.  Old records are reviewed and she does have a prior ED visit for torticollis.  She will be given IV ketorolac,  methocarbamol, ondansetron and a small dose of morphine as well.  ECG shows  No acute changes. Labs are significant only for mild microcytic anemia.  She had significant relief with above-noted treatment and is discharged with prescriptions for orphenadrine, naproxen, oxycodone-acetaminophen.  Prior to prescribing oxycodone acetaminophen, her record on the [place] controlled substance reporting website was reviewed and she has had very limited narcotic prescriptions in the past.  Final Clinical Impression(s) / ED Diagnoses Final diagnoses:  Muscle spasm of left shoulder area  Microcytic anemia    Rx / DC Orders ED Discharge Orders         Ordered    naproxen (NAPROSYN) 375 MG tablet  2 times daily     09/20/19 0553    orphenadrine (NORFLEX) 100 MG tablet  2 times daily  09/20/19 0553    oxyCODONE-acetaminophen (PERCOCET) 5-325 MG tablet  Every 4 hours PRN     09/20/19 0000000           Delora Fuel, [place] [id number] [phone]

## 2019-09-20 NOTE — Discharge Instructions (Addendum)
Apply ice as needed.  Take acetaminophen as needed for mild to moderate pain. Take oxycodone-acetaminophen as needed for more severe pain.  Return is symptoms are getting worse.

## 2019-09-20 NOTE — ED Triage Notes (Addendum)
Patient reports right shoulder joint pain radiating to right neck and upper chest onset last night, pain increases with movement /changing positions , unable to lift right arm due to pain , denies injury or fall , no fever or chills .

## 2019-10-18 ENCOUNTER — Other Ambulatory Visit: Payer: Self-pay | Admitting: *Deleted

## 2019-10-18 DIAGNOSIS — Z20822 Contact with and (suspected) exposure to covid-19: Secondary | ICD-10-CM

## 2019-10-19 LAB — NOVEL CORONAVIRUS, NAA: SARS-CoV-2, NAA: NOT DETECTED

## 2019-11-09 ENCOUNTER — Other Ambulatory Visit (HOSPITAL_COMMUNITY): Payer: Self-pay | Admitting: Family Medicine

## 2019-11-09 ENCOUNTER — Ambulatory Visit (HOSPITAL_COMMUNITY)
Admission: RE | Admit: 2019-11-09 | Discharge: 2019-11-09 | Disposition: A | Discharge status: Home / Self Care | Payer: Medicare Other | Source: Ambulatory Visit | Attending: Family Medicine | Admitting: Family Medicine

## 2019-11-09 ENCOUNTER — Other Ambulatory Visit: Payer: Self-pay

## 2019-11-09 DIAGNOSIS — M79604 Pain in right leg: Secondary | ICD-10-CM

## 2019-11-09 DIAGNOSIS — M7989 Other specified soft tissue disorders: Secondary | ICD-10-CM

## 2019-11-09 NOTE — Progress Notes (Signed)
Right lower extremity venous duplex has been completed. Preliminary results can be found in CV Proc through chart review.  Results were given to Dr. Jacelyn Grip.  11/09/19 4:40 PM Sandra Carr RVT

## 2019-11-16 DIAGNOSIS — M25561 Pain in right knee: Secondary | ICD-10-CM | POA: Insufficient documentation

## 2019-11-29 DIAGNOSIS — M545 Low back pain, unspecified: Secondary | ICD-10-CM | POA: Insufficient documentation

## 2020-01-10 ENCOUNTER — Other Ambulatory Visit: Payer: Self-pay | Admitting: Obstetrics and Gynecology

## 2020-01-10 DIAGNOSIS — R928 Other abnormal and inconclusive findings on diagnostic imaging of breast: Secondary | ICD-10-CM

## 2020-01-18 ENCOUNTER — Ambulatory Visit
Admission: RE | Admit: 2020-01-18 | Discharge: 2020-01-18 | Disposition: A | Discharge status: Home / Self Care | Payer: Medicare Other | Source: Ambulatory Visit | Attending: Obstetrics and Gynecology | Admitting: Obstetrics and Gynecology

## 2020-01-18 ENCOUNTER — Other Ambulatory Visit: Payer: Self-pay

## 2020-01-18 DIAGNOSIS — R928 Other abnormal and inconclusive findings on diagnostic imaging of breast: Secondary | ICD-10-CM

## 2020-04-04 ENCOUNTER — Other Ambulatory Visit: Payer: Self-pay

## 2020-04-04 ENCOUNTER — Ambulatory Visit: Payer: Medicare Other | Admitting: Podiatry

## 2020-04-04 DIAGNOSIS — L600 Ingrowing nail: Secondary | ICD-10-CM | POA: Diagnosis not present

## 2020-04-04 DIAGNOSIS — M79604 Pain in right leg: Secondary | ICD-10-CM

## 2020-04-04 NOTE — Patient Instructions (Signed)

## 2020-04-05 NOTE — Progress Notes (Signed)
Subjective:   Patient ID: Sandra Carr, female   DOB: 64 y.o.   MRN: [id number]   HPI 64 year old female presents the office today for concerns of a possible ingrown toenail.  She states that she gets pain to her toe that goes up her leg and is only at nighttime.  She has no pain to the toenail itself otherwise that she denies any swelling or redness or drainage.  She has no pain today currently.  She states that she has seen her primary care physician for the leg pain.   Review of Systems  All other systems reviewed and are negative.  Past Medical History:  Diagnosis Date  . Asthma     No past surgical history on file.   Current Outpatient Medications:  .  albuterol (PROVENTIL HFA;VENTOLIN HFA) 108 (90 BASE) MCG/ACT inhaler, Inhale 2 puffs into the lungs every 6 (six) hours as needed for wheezing or shortness of breath (wheezing)., Disp: , Rfl:  .  albuterol (PROVENTIL) (2.5 MG/3ML) 0.083% nebulizer solution, Take 2.5 mg by nebulization every 6 (six) hours as needed for wheezing or shortness of breath (wheezing)., Disp: , Rfl:  .  amLODipine (NORVASC) 5 MG tablet, Take 5 mg by mouth daily., Disp: , Rfl:  .  Ascorbic Acid (VITAMIN C PO), Take 1 tablet by mouth daily., Disp: , Rfl:  .  cholecalciferol (VITAMIN D3) 25 MCG (1000 UT) tablet, Take 1,000 Units by mouth daily., Disp: , Rfl:  .  Multiple Vitamin (MULTIVITAMIN WITH MINERALS) TABS tablet, Take 1 tablet by mouth daily., Disp: , Rfl:  .  naproxen (NAPROSYN) 375 MG tablet, Take 1 tablet (375 mg total) by mouth 2 (two) times daily., Disp: 30 tablet, Rfl: 0 .  orphenadrine (NORFLEX) 100 MG tablet, Take 1 tablet (100 mg total) by mouth 2 (two) times daily., Disp: 30 tablet, Rfl: 0 .  oxyCODONE-acetaminophen (PERCOCET) 5-325 MG tablet, Take 1 tablet by mouth every 4 (four) hours as needed for moderate pain., Disp: 10 tablet, Rfl: 0 .  zolpidem (AMBIEN CR) 12.5 MG CR tablet, Take 6.25 mg by mouth at bedtime as needed for sleep (sleep).,  Disp: , Rfl:   Allergies  Allergen Reactions  . Peanut-Containing Drug Products Shortness Of Breath  . Iohexol      Code: HIVES, Desc: patient claims hive from iv contrast. she drank omnipaque laced water oral contrast before informing anyone of an allergy (no problem from the oral at this time). no record of any enhancable procedures done in our system., Onset Date: [id number]   . Latex Swelling  . Shellfish Allergy Other (See Comments)    Mouth and Throat Swelling.         Objective:  Physical Exam  General: AAO x3, NAD  Dermatological: Mild incurvation of the distal aspects of bilateral hallux toenails of the medial lateral aspects of the nail.  There is no edema, erythema, drainage or pus or signs of infection there is no pain.  There is no open lesions.  Vascular: Dorsalis Pedis artery and Posterior Tibial artery pedal pulses are 2/4 bilateral with immedate capillary fill time. There is no pain with calf compression, swelling, warmth, erythema.   Neruologic: Grossly intact via light touch bilateral. Vibratory intact via tuning fork bilateral. Negative tinel sign.   Musculoskeletal: There is no tenderness palpation of the nail there is no pain to the leg today.  Muscular strength 5/5 in all groups tested bilateral.  Gait: Unassisted, Nonantalgic.       Assessment:  Ingrown toenail with right leg neuritis    Plan:  -Treatment options discussed including all alternatives, risks, and complications -Etiology of symptoms were discussed -Not convinced that the toenail self is causing the pain that should not be causing leg pain.  This is coming more from her other nerve issues.  I did debride the nails with any complications or bleeding.  Discussed with her partial nail removal which is at this time previously and she states it came right back and she is hesitant to do it again.  Recommend Epson salt soaks as well as antibiotic ointment daily. -ABI performed in the office today  left was 1.3 right 1.27  Return in about 6 weeks (around 05/16/2020).  Trula Slade DPM

## 2020-05-03 ENCOUNTER — Other Ambulatory Visit: Payer: Self-pay

## 2020-05-03 ENCOUNTER — Other Ambulatory Visit: Payer: Medicare Other

## 2020-05-03 DIAGNOSIS — Z20822 Contact with and (suspected) exposure to covid-19: Secondary | ICD-10-CM

## 2020-05-04 LAB — SARS-COV-2, NAA 2 DAY TAT

## 2020-05-04 LAB — NOVEL CORONAVIRUS, NAA: SARS-CoV-2, NAA: NOT DETECTED

## 2020-05-17 ENCOUNTER — Other Ambulatory Visit: Payer: Medicare Other

## 2020-05-17 ENCOUNTER — Other Ambulatory Visit: Payer: Self-pay

## 2020-05-17 DIAGNOSIS — Z20822 Contact with and (suspected) exposure to covid-19: Secondary | ICD-10-CM

## 2020-05-19 LAB — NOVEL CORONAVIRUS, NAA: SARS-CoV-2, NAA: NOT DETECTED

## 2020-05-19 LAB — SARS-COV-2, NAA 2 DAY TAT

## 2020-05-22 ENCOUNTER — Other Ambulatory Visit: Payer: Self-pay

## 2020-05-22 ENCOUNTER — Ambulatory Visit: Payer: Medicare Other | Admitting: Podiatry

## 2020-05-22 DIAGNOSIS — L600 Ingrowing nail: Secondary | ICD-10-CM

## 2020-05-22 DIAGNOSIS — B351 Tinea unguium: Secondary | ICD-10-CM

## 2020-05-22 DIAGNOSIS — M722 Plantar fascial fibromatosis: Secondary | ICD-10-CM | POA: Diagnosis not present

## 2020-05-22 NOTE — Patient Instructions (Addendum)
Look at getting a different shoe such as Marco Collie can use fungi-nail on the toenails  Plantar Fasciitis (Heel Spur Syndrome) with Rehab The plantar fascia is a fibrous, ligament-like, soft-tissue structure that spans the bottom of the foot. Plantar fasciitis is a condition that causes pain in the foot due to inflammation of the tissue. SYMPTOMS   Pain and tenderness on the underneath side of the foot.  Pain that worsens with standing or walking. CAUSES  Plantar fasciitis is caused by irritation and injury to the plantar fascia on the underneath side of the foot. Common mechanisms of injury include:  Direct trauma to bottom of the foot.  Damage to a small nerve that runs under the foot where the main fascia attaches to the heel bone.  Stress placed on the plantar fascia due to bone spurs. RISK INCREASES WITH:   Activities that place stress on the plantar fascia (running, jumping, pivoting, or cutting).  Poor strength and flexibility.  Improperly fitted shoes.  Tight calf muscles.  Flat feet.  Failure to warm-up properly before activity.  Obesity. PREVENTION  Warm up and stretch properly before activity.  Allow for adequate recovery between workouts.  Maintain physical fitness:  Strength, flexibility, and endurance.  Cardiovascular fitness.  Maintain a health body weight.  Avoid stress on the plantar fascia.  Wear properly fitted shoes, including arch supports for individuals who have flat feet.  PROGNOSIS  If treated properly, then the symptoms of plantar fasciitis usually resolve without surgery. However, occasionally surgery is necessary.  RELATED COMPLICATIONS   Recurrent symptoms that may result in a chronic condition.  Problems of the lower back that are caused by compensating for the injury, such as limping.  Pain or weakness of the foot during push-off following surgery.  Chronic inflammation, scarring, and partial or complete fascia tear,  occurring more often from repeated injections.  TREATMENT  Treatment initially involves the use of ice and medication to help reduce pain and inflammation. The use of strengthening and stretching exercises may help reduce pain with activity, especially stretches of the Achilles tendon. These exercises may be performed at home or with a therapist. Your caregiver may recommend that you use heel cups of arch supports to help reduce stress on the plantar fascia. Occasionally, corticosteroid injections are given to reduce inflammation. If symptoms persist for greater than 6 months despite non-surgical (conservative), then surgery may be recommended.   MEDICATION   If pain medication is necessary, then nonsteroidal anti-inflammatory medications, such as aspirin and ibuprofen, or other minor pain relievers, such as acetaminophen, are often recommended.  Do not take pain medication within 7 days before surgery.  Prescription pain relievers may be given if deemed necessary by your caregiver. Use only as directed and only as much as you need.  Corticosteroid injections may be given by your caregiver. These injections should be reserved for the most serious cases, because they may only be given a certain number of times.  HEAT AND COLD  Cold treatment (icing) relieves pain and reduces inflammation. Cold treatment should be applied for 10 to 15 minutes every 2 to 3 hours for inflammation and pain and immediately after any activity that aggravates your symptoms. Use ice packs or massage the area with a piece of ice (ice massage).  Heat treatment may be used prior to performing the stretching and strengthening activities prescribed by your caregiver, physical therapist, or athletic trainer. Use a heat pack or soak the injury in warm water.  SEEK IMMEDIATE MEDICAL CARE IF:  Treatment seems to offer no benefit, or the condition worsens.  Any medications produce adverse side effects.  EXERCISES- RANGE OF  MOTION (ROM) AND STRETCHING EXERCISES - Plantar Fasciitis (Heel Spur Syndrome) These exercises may help you when beginning to rehabilitate your injury. Your symptoms may resolve with or without further involvement from your physician, physical therapist or athletic trainer. While completing these exercises, remember:   Restoring tissue flexibility helps normal motion to return to the joints. This allows healthier, less painful movement and activity.  An effective stretch should be held for at least 30 seconds.  A stretch should never be painful. You should only feel a gentle lengthening or release in the stretched tissue.  RANGE OF MOTION - Toe Extension, Flexion  Sit with your right / left leg crossed over your opposite knee.  Grasp your toes and gently pull them back toward the top of your foot. You should feel a stretch on the bottom of your toes and/or foot.  Hold this stretch for 10 seconds.  Now, gently pull your toes toward the bottom of your foot. You should feel a stretch on the top of your toes and or foot.  Hold this stretch for 10 seconds. Repeat  times. Complete this stretch 3 times per day.   RANGE OF MOTION - Ankle Dorsiflexion, Active Assisted  Remove shoes and sit on a chair that is preferably not on a carpeted surface.  Place right / left foot under knee. Extend your opposite leg for support.  Keeping your heel down, slide your right / left foot back toward the chair until you feel a stretch at your ankle or calf. If you do not feel a stretch, slide your bottom forward to the edge of the chair, while still keeping your heel down.  Hold this stretch for 10 seconds. Repeat 3 times. Complete this stretch 2 times per day.   STRETCH  Gastroc, Standing  Place hands on wall.  Extend right / left leg, keeping the front knee somewhat bent.  Slightly point your toes inward on your back foot.  Keeping your right / left heel on the floor and your knee straight, shift  your weight toward the wall, not allowing your back to arch.  You should feel a gentle stretch in the right / left calf. Hold this position for 10 seconds. Repeat 3 times. Complete this stretch 2 times per day.  STRETCH  Soleus, Standing  Place hands on wall.  Extend right / left leg, keeping the other knee somewhat bent.  Slightly point your toes inward on your back foot.  Keep your right / left heel on the floor, bend your back knee, and slightly shift your weight over the back leg so that you feel a gentle stretch deep in your back calf.  Hold this position for 10 seconds. Repeat 3 times. Complete this stretch 2 times per day.  STRETCH  Gastrocsoleus, Standing  Note: This exercise can place a lot of stress on your foot and ankle. Please complete this exercise only if specifically instructed by your caregiver.   Place the ball of your right / left foot on a step, keeping your other foot firmly on the same step.  Hold on to the wall or a rail for balance.  Slowly lift your other foot, allowing your body weight to press your heel down over the edge of the step.  You should feel a stretch in your right / left calf.  Hold this position for 10 seconds.  Repeat this exercise with a slight bend in your right / left knee. Repeat 3 times. Complete this stretch 2 times per day.   STRENGTHENING EXERCISES - Plantar Fasciitis (Heel Spur Syndrome)  These exercises may help you when beginning to rehabilitate your injury. They may resolve your symptoms with or without further involvement from your physician, physical therapist or athletic trainer. While completing these exercises, remember:   Muscles can gain both the endurance and the strength needed for everyday activities through controlled exercises.  Complete these exercises as instructed by your physician, physical therapist or athletic trainer. Progress the resistance and repetitions only as guided.  STRENGTH - Towel Curls  Sit in  a chair positioned on a non-carpeted surface.  Place your foot on a towel, keeping your heel on the floor.  Pull the towel toward your heel by only curling your toes. Keep your heel on the floor. Repeat 3 times. Complete this exercise 2 times per day.  STRENGTH - Ankle Inversion  Secure one end of a rubber exercise band/tubing to a fixed object (table, pole). Loop the other end around your foot just before your toes.  Place your fists between your knees. This will focus your strengthening at your ankle.  Slowly, pull your big toe up and in, making sure the band/tubing is positioned to resist the entire motion.  Hold this position for 10 seconds.  Have your muscles resist the band/tubing as it slowly pulls your foot back to the starting position. Repeat 3 times. Complete this exercises 2 times per day.  Document Released: 08/26/2005 Document Revised: 11/18/2011 Document Reviewed: 12/08/2008 [facility] Patient Information 2014 [place], [place].

## 2020-05-23 NOTE — Progress Notes (Signed)
Subjective: 64 year old female presents the office today for concerns still of some recurrence of ingrown toenail, discomfort to right great toe.  She states that she previously had any of the lesions came back she had to have the nail removed again.  Also she still reports that she is getting pain from her hip down to her foot describing aching sensation.  At the end of the appointment she also reported she was having some discomfort in the bottom of the heel.  She denies any recent injury or trauma.  This is worse in the morning.  It is better with activity.  Denies any systemic complaints such as fevers, chills, nausea, vomiting. No acute changes since last appointment, and no other complaints at this time.   Objective: AAO x3, NAD DP/PT pulses palpable bilaterally, CRT less than 3 seconds On the right hallux toenail there is incurvation present the distal medial aspect with mild tenderness palpation.  There is no edema, erythema any signs of infection noted today.   Nails appear to be hypertrophic, dystrophic with brown discoloration. There is minimal tenderness palpation on plantar medial tubercle of the calcaneus with insertion of plantar fascia.  Backslash appears to be intact.  No pain about compression of calcaneus or Achilles tendon.  Equinus is present.  MMT 5/5. No pain with calf compression, swelling, warmth, erythema  Assessment: 64 year old female with ingrown toenail, plantar fasciitis; right leg pain  Plan: -All treatment options discussed with the patient including all alternatives, risks, complications.  -In regards to ingrown toenail discussed partial nail avulsion which she was followed up on this.  Sharply debrided the nail with any complications or bleeding.  Discussed treatment options for nail fungus.  Start of oral medication.  She is instilling topical Fungi-Nail. -The heel pain discussed stretching, icing exercises daily.  We discussed shoe modifications and also inserts  for shoes if needed.  If no improvement will get x-rays next appointment. -We discussed possible neurology evaluation for her right leg pain however recommend her to contact her primary care physician. -Patient encouraged to call the office with any questions, concerns, change in symptoms.   Trula Slade DPM

## 2020-07-08 ENCOUNTER — Ambulatory Visit: Payer: Medicare Other | Attending: Internal Medicine

## 2020-07-08 DIAGNOSIS — Z23 Encounter for immunization: Secondary | ICD-10-CM

## 2020-07-08 NOTE — Progress Notes (Signed)
   Covid-19 Vaccination Clinic  Name:  Sandra Carr    MRN: [id number] DOB: 11-Jun-1956  07/08/2020  Sandra Carr was observed post Covid-19 immunization for 15 minutes without incident. She was provided with Vaccine Information Sheet and instruction to access the V-Safe system.   Sandra Carr was instructed to call 911 with any severe reactions post vaccine: Marland Kitchen Difficulty breathing  . Swelling of face and throat  . A fast heartbeat  . A bad rash all over body  . Dizziness and weakness

## 2020-08-22 ENCOUNTER — Ambulatory Visit: Payer: Medicare Other | Admitting: Podiatry

## 2020-08-22 ENCOUNTER — Other Ambulatory Visit: Payer: Self-pay

## 2020-08-22 DIAGNOSIS — L600 Ingrowing nail: Secondary | ICD-10-CM

## 2020-08-22 DIAGNOSIS — M79604 Pain in right leg: Secondary | ICD-10-CM

## 2020-08-24 NOTE — Progress Notes (Signed)
Subjective: 64 year old female presents the office today for follow evaluation of ingrown toenail right big toe, lateral aspect.  She is the area still sore but no drainage or pus or swelling or redness.  In general she denies any other foot pain at this time.  She has no other concerns. Denies any systemic complaints such as fevers, chills, nausea, vomiting. No acute changes since last appointment, and no other complaints at this time.   Objective: AAO x3, NAD DP/PT pulses palpable bilaterally, CRT less than 3 seconds Incurvation present on the lateral aspect of right hallux toenail the distal aspect.  There is no edema, erythema, drainage or pus or any signs of infection noted today. No open lesions.  No other areas of discomfort identified bilaterally.  No pain with calf compression, swelling, warmth, erythema  Assessment: Ingrown toenail right hallux, without infection  Plan: -All treatment options discussed with the patient including all alternatives, risks, complications.  -Today discussed and recommended partial nail avulsion however she was ultimately treated intraoperatively the symptomatic portion of ingrown toenail and a slant back fashion without any complications or bleeding.  Recommend Epson salt soaks and monitoring signs or symptoms of infection, reoccurrence. -Patient encouraged to call the office with any questions, concerns, change in symptoms.   Trula Slade DPM

## 2020-10-26 ENCOUNTER — Other Ambulatory Visit: Payer: Self-pay | Admitting: Family Medicine

## 2020-10-26 ENCOUNTER — Other Ambulatory Visit (HOSPITAL_COMMUNITY): Payer: Self-pay | Admitting: Family Medicine

## 2020-10-26 DIAGNOSIS — R1013 Epigastric pain: Secondary | ICD-10-CM

## 2020-10-27 ENCOUNTER — Ambulatory Visit (HOSPITAL_COMMUNITY)
Admission: RE | Admit: 2020-10-27 | Discharge: 2020-10-27 | Disposition: A | Discharge status: Home / Self Care | Payer: Medicare Other | Source: Ambulatory Visit | Attending: Family Medicine | Admitting: Family Medicine

## 2020-10-27 ENCOUNTER — Other Ambulatory Visit: Payer: Self-pay

## 2020-10-27 DIAGNOSIS — R1013 Epigastric pain: Secondary | ICD-10-CM | POA: Diagnosis not present

## 2020-10-30 ENCOUNTER — Other Ambulatory Visit: Payer: Self-pay | Admitting: Gastroenterology

## 2020-10-30 ENCOUNTER — Other Ambulatory Visit: Payer: Self-pay | Admitting: Legal Medicine

## 2020-10-30 DIAGNOSIS — R1084 Generalized abdominal pain: Secondary | ICD-10-CM

## 2020-10-30 DIAGNOSIS — R1013 Epigastric pain: Secondary | ICD-10-CM

## 2020-11-06 ENCOUNTER — Telehealth: Payer: Self-pay

## 2020-11-06 MED ORDER — PREDNISONE 50 MG PO TABS: ORAL_TABLET | ORAL | 0 refills | Status: DC

## 2020-11-06 NOTE — Telephone Encounter (Signed)
Phone call to patient to review instructions for 13 hr prep for CT w/ contrast on 11/14/20 at 2pm. Prescription called into [facility]. Pt aware and verbalized understanding of instructions. Prescription: 3/8 at 1 am- 50mg  Prednisone 3/8 at 7 am- 50mg  Prednisone 3/8 at 1 pm - 50mg  Prednisone and 50mg  Benadryl

## 2020-11-14 ENCOUNTER — Other Ambulatory Visit: Payer: Self-pay

## 2020-11-14 ENCOUNTER — Ambulatory Visit
Admission: RE | Admit: 2020-11-14 | Discharge: 2020-11-14 | Disposition: A | Discharge status: Home / Self Care | Payer: Medicare Other | Source: Ambulatory Visit | Attending: Gastroenterology | Admitting: Gastroenterology

## 2020-11-14 DIAGNOSIS — R1084 Generalized abdominal pain: Secondary | ICD-10-CM

## 2020-11-14 DIAGNOSIS — R1013 Epigastric pain: Secondary | ICD-10-CM

## 2020-11-14 MED ORDER — IOPAMIDOL (ISOVUE-300) INJECTION 61%
100.0000 mL | Freq: Once | INTRAVENOUS | Status: AC | PRN
  Administered 2020-11-14: 100 mL via INTRAVENOUS

## 2020-12-04 ENCOUNTER — Telehealth: Payer: Self-pay

## 2020-12-04 ENCOUNTER — Encounter: Payer: Self-pay | Admitting: Cardiology

## 2020-12-04 ENCOUNTER — Ambulatory Visit: Payer: Medicare Other | Admitting: Cardiology

## 2020-12-04 ENCOUNTER — Other Ambulatory Visit: Payer: Self-pay

## 2020-12-04 VITALS — BP 135/84 | HR 77 | Temp 98.2°F | Resp 16 | Ht 64.0 in | Wt 148.0 lb

## 2020-12-04 DIAGNOSIS — R0789 Other chest pain: Secondary | ICD-10-CM

## 2020-12-04 DIAGNOSIS — R0600 Dyspnea, unspecified: Secondary | ICD-10-CM

## 2020-12-04 DIAGNOSIS — I10 Essential (primary) hypertension: Secondary | ICD-10-CM

## 2020-12-04 DIAGNOSIS — R0609 Other forms of dyspnea: Secondary | ICD-10-CM

## 2020-12-04 NOTE — Telephone Encounter (Signed)
error 

## 2020-12-04 NOTE — Progress Notes (Signed)
Primary Physician/Referring:  Vernie Shanks, MD  Patient ID: Sandra Carr, female    DOB: March 29, 1956, 65 y.o.   MRN: [id number]  Chief Complaint  Patient presents with  . substernal discomfort  . Coronary Artery Disease  . New Patient (Initial Visit)   HPI:    Sandra Carr  is a 65 y.o. African-American female with hypertension, hyperglycemia, remote reactive airway disease, since January 2022, has been having abdominal discomfort and frequent episodes of belching.  She has had extensive GI evaluation and eventually referred for cardiac evaluation in view of chest discomfort and dyspnea on exertion.  Patient states that she has been exercising regularly and has noticed gradual dyspnea and described as mild symptoms.  No PND or orthopnea.  No leg edema.  Chest discomfort is described as bandlike sensation across the chest, happens when she eats or when she belches and not necessarily related to activity.  Past Medical History:  Diagnosis Date  . Asthma    Past Surgical History:  Procedure Laterality Date  . ABDOMINAL HYSTERECTOMY    . FOOT SURGERY Left    Family History  Problem Relation Age of Onset  . Breast cancer Cousin   . Hypertension Father   . Heart failure Brother   . Heart disease Brother     Social History   Tobacco Use  . Smoking status: Former Smoker    Packs/day: 0.25    Years: 20.00    Pack years: 5.00    Types: Cigarettes    Quit date: 1998    Years since quitting: 24.2  . Smokeless tobacco: Never Used  Substance Use Topics  . Alcohol use: Yes    Comment: occ wine   Marital Status: Divorced  ROS  Review of Systems  Cardiovascular: Positive for chest pain and dyspnea on exertion. Negative for leg swelling.  Gastrointestinal: Positive for flatus and heartburn. Negative for melena.   Objective  Blood pressure 135/84, pulse 77, temperature 98.2 F (36.8 C), resp. rate 16, height _0  (1.626 m), weight 148 lb (67.1 kg), last menstrual period  05/18/1993, SpO2 96 %.  Vitals with BMI 12/04/2020 09/20/2019 09/20/2019  Height _1  - -  Weight 148 lbs - -  BMI 80.22 - -  Systolic 336 122 449  Diastolic 84 77 77  Pulse 77 80 80     Physical Exam Cardiovascular:     Rate and Rhythm: Normal rate and regular rhythm.     Pulses: Intact distal pulses.     Heart sounds: Murmur heard.   Early systolic murmur is present with a grade of 2/6 at the upper right sternal border. No gallop.   Pulmonary:     Effort: Pulmonary effort is normal.     Breath sounds: Normal breath sounds.  Abdominal:     General: Bowel sounds are normal.     Palpations: Abdomen is soft.  Musculoskeletal:     Cervical back: Neck supple.     Right lower leg: No edema.     Left lower leg: No edema.    Laboratory examination:   No results for input(s): NA, K, CL, CO2, GLUCOSE, BUN, CREATININE, CALCIUM, GFRNONAA, GFRAA in the last 8760 hours. CrCl cannot be calculated (Patient's most recent lab result is older than the maximum 21 days allowed.).  CMP Latest Ref Rng & Units 09/20/2019 02/14/2009  Glucose 70 - 99 mg/dL 126(H) 102(H)  BUN 8 - 23 mg/dL 7(L) 9  Creatinine 0.44 - 1.00 mg/dL 0.78 0.73  Sodium 135 - 145 mmol/L 140 141  Potassium 3.5 - 5.1 mmol/L 3.8 4.0  Chloride 98 - 111 mmol/L 106 108  CO2 22 - 32 mmol/L 23 28  Calcium 8.9 - 10.3 mg/dL 9.4 10.5   CBC Latest Ref Rng & Units 09/20/2019 02/14/2009  WBC 4.0 - 10.5 K/uL 10.1 8.0  Hemoglobin 12.0 - 15.0 g/dL 11.1(L) 11.6(L)  Hematocrit 36.0 - 46.0 % 35.3(L) 35.8(L)  Platelets 150 - 400 K/uL 536(H) 370     External labs:   Labs 11/09/2020:  Total cholesterol 158, triglycerides 70, HDL 61, LDL 84.  Amylase normal.  Hb 11.2/HCT 35.5, platelets 484, mildly elevated.  Serum glucose 59, BUN 11, creatinine 0.71, EGFR 82/100 mL, potassium 3.7.  CMP otherwise normal.  Vitamin D 46.0.  Medications and allergies   Allergies  Allergen Reactions  . Other Hives and Anaphylaxis  . Peanut-Containing  Drug Products Shortness Of Breath  . Iohexol      Code: HIVES, Desc: patient claims hive from iv contrast. she drank omnipaque laced water oral contrast before informing anyone of an allergy (no problem from the oral at this time). no record of any enhancable procedures done in our system., Onset Date: [id number]   . Latex Swelling  . Methocarbamol Swelling  . Shellfish Allergy Other (See Comments)    Mouth and Throat Swelling.  . Triamcinolone Other (See Comments)  . Hydrochlorothiazide Rash    Current Meds  Medication Sig  . albuterol (PROVENTIL HFA;VENTOLIN HFA) 108 (90 BASE) MCG/ACT inhaler Inhale 2 puffs into the lungs every 6 (six) hours as needed for wheezing or shortness of breath (wheezing).  Marland Kitchen albuterol (PROVENTIL) (2.5 MG/3ML) 0.083% nebulizer solution Take 2.5 mg by nebulization every 6 (six) hours as needed for wheezing or shortness of breath (wheezing).  Marland Kitchen amLODipine (NORVASC) 5 MG tablet Take 5 mg by mouth daily.  . Ascorbic Acid (VITAMIN C) 500 MG CHEW 1 tablet  . cholecalciferol (VITAMIN D3) 25 MCG (1000 UT) tablet Take 1,000 Units by mouth daily.  . clobetasol cream (TEMOVATE) 0.05 % clobetasol 0.05 % topical cream  APPLY A THIN LAYER TO THE AFFECTED AREA(S) BY TOPICAL ROUTE 2 TIMES PER DAY  . cyclobenzaprine (FLEXERIL) 10 MG tablet Take 10 mg by mouth 3 (three) times daily.  Marland Kitchen EPINEPHrine 0.3 mg/0.3 mL IJ SOAJ injection epinephrine 0.3 mg/0.3 mL injection, auto-injector  . Fluticasone-Salmeterol (ADVAIR) 250-50 MCG/DOSE AEPB SMARTSIG:By Mouth  . Hyoscyamine Sulfate SL 0.125 MG SUBL Place 1 tablet under the tongue as needed.  . Multiple Vitamins-Minerals (CENTRUM SILVER 50+WOMEN PO) Centrum Silver  . pantoprazole (PROTONIX) 40 MG tablet 1 tablet  . zolpidem (AMBIEN CR) 12.5 MG CR tablet Take 6.25 mg by mouth at bedtime as needed for sleep (sleep).     Radiology:   No results found.   Cardiac Studies:   None EKG:     EKG 12/04/2020: Normal sinus rhythm at rate of  70 bpm, leftward axis.  Otherwise normal EKG.    Assessment     ICD-10-CM   1. Atypical chest pain  R07.89 EKG 12-Lead    PCV ECHOCARDIOGRAM COMPLETE    PCV CARDIAC STRESS TEST  2. Dyspnea on exertion  R06.00 PCV ECHOCARDIOGRAM COMPLETE  3. Primary hypertension  I10       No orders of the defined types were placed in this encounter.  Orders Placed This Encounter  Procedures  . PCV CARDIAC STRESS TEST    Standing Status:   Future    Standing  Expiration Date:   02/03/2021  . EKG 12-Lead  . PCV ECHOCARDIOGRAM COMPLETE    Standing Status:   Future    Standing Expiration Date:   12/04/2021    Recommendations:   Dodie Parisi is a 65 y.o. African-American female with hypertension, hyperglycemia, remote reactive airway disease, since January 2022, has been having abdominal discomfort and frequent episodes of belching.  She has had extensive GI evaluation and eventually referred for cardiac evaluation in view of chest discomfort and dyspnea on exertion.  Her symptoms do not appear to suggest angina however in view of hypoglycemia, hypertension as risk factors, I would recommend a routine treadmill stress test in view of normal EKG.  I will also perform an echocardiogram in view of dyspnea on exertion.  I advised her to stop all the supplements to see if her GI symptoms would improve.  She has not had any asthmatic attacks in many years and would like to discontinue Advair as well.  Advised her that she should certainly follow-up with her PCP regarding this.  I would like to see her back in 4 weeks for follow-up.    Adrian Prows, MD, [facility] 12/04/2020, 1:53 PM Office: [phone]

## 2020-12-14 ENCOUNTER — Other Ambulatory Visit: Payer: Self-pay

## 2020-12-14 ENCOUNTER — Ambulatory Visit: Payer: Medicare Other

## 2020-12-14 DIAGNOSIS — R0600 Dyspnea, unspecified: Secondary | ICD-10-CM

## 2020-12-14 DIAGNOSIS — R0789 Other chest pain: Secondary | ICD-10-CM

## 2020-12-14 DIAGNOSIS — R0609 Other forms of dyspnea: Secondary | ICD-10-CM

## 2021-01-05 ENCOUNTER — Other Ambulatory Visit: Payer: Self-pay

## 2021-01-05 ENCOUNTER — Ambulatory Visit: Payer: Medicare Other

## 2021-01-05 DIAGNOSIS — R0789 Other chest pain: Secondary | ICD-10-CM

## 2021-01-06 NOTE — Progress Notes (Signed)
Normal GXT

## 2021-01-09 ENCOUNTER — Encounter: Payer: Self-pay | Admitting: Cardiology

## 2021-01-09 ENCOUNTER — Ambulatory Visit: Payer: Medicare Other | Admitting: Cardiology

## 2021-01-09 ENCOUNTER — Other Ambulatory Visit: Payer: Self-pay

## 2021-01-09 VITALS — BP 119/72 | HR 79 | Temp 98.2°F | Resp 17 | Ht 64.0 in | Wt 148.8 lb

## 2021-01-09 DIAGNOSIS — R0789 Other chest pain: Secondary | ICD-10-CM

## 2021-01-09 DIAGNOSIS — R0600 Dyspnea, unspecified: Secondary | ICD-10-CM

## 2021-01-09 DIAGNOSIS — R0609 Other forms of dyspnea: Secondary | ICD-10-CM

## 2021-01-09 NOTE — Progress Notes (Signed)
Primary Physician/Referring:  Vernie Shanks, MD  Patient ID: Sandra Carr, female    DOB: 07-Feb-1956, 65 y.o.   MRN: [id number]  Chief Complaint  Patient presents with  . Chest Pain  . Shortness of Breath  . Follow-up    4 WEEKS   HPI:    Sandra Carr  is a 65 y.o. African-American female with hypertension, hyperglycemia, remote reactive airway disease, since January 2022, has been having abdominal discomfort and frequent episodes of belching.  She has had extensive GI evaluation and eventually referred for cardiac evaluation in view of chest discomfort and dyspnea on exertion.  Patient states that she has been exercising regularly and has noticed gradual dyspnea and described as mild symptoms.  No PND or orthopnea.  No leg edema.  Chest discomfort is described as bandlike sensation across the chest, happens when she eats or when she belches and not necessarily related to activity.  She has had extensive GI evaluation, she recently had endoscopy and she is awaiting the results of the same.  I had seen her about 4 to 6 weeks ago and had recommended a routine treadmill stress test and also an echocardiogram and she now presents for follow-up.  States her symptoms of chest discomfort has improved.  She has continued to remain active and does not had any significant dyspnea, PND or orthopnea or leg edema.  Past Medical History:  Diagnosis Date  . Asthma    Past Surgical History:  Procedure Laterality Date  . ABDOMINAL HYSTERECTOMY    . FOOT SURGERY Left    Family History  Problem Relation Age of Onset  . Breast cancer Cousin   . Hypertension Mother   . Diabetes Mother   . Hypertension Father   . Heart failure Brother   . Heart disease Brother     Social History   Tobacco Use  . Smoking status: Former Smoker    Packs/day: 0.25    Years: 20.00    Pack years: 5.00    Types: Cigarettes    Quit date: 1998    Years since quitting: 24.3  . Smokeless tobacco: Never Used   Substance Use Topics  . Alcohol use: Yes    Comment: occ wine   Marital Status: Divorced  ROS  Review of Systems  Cardiovascular: Positive for chest pain and dyspnea on exertion. Negative for leg swelling.  Gastrointestinal: Positive for flatus and heartburn. Negative for melena.   Objective  Blood pressure 119/72, pulse 79, temperature 98.2 F (36.8 C), temperature source Temporal, resp. rate 17, height $RemoveBe'5\' 4"'RdgvLpYUX$  (1.626 m), weight 148 lb 12.8 oz (67.5 kg), last menstrual period 05/18/1993, SpO2 98 %.  Vitals with BMI 01/09/2021 12/04/2020 09/20/2019  Height $Remov'5\' 4"'IfaVRZ$  $Remove'5\' 4"'eyhqwpU$  -  Weight 148 lbs 13 oz 148 lbs -  BMI 58.85 02.77 -  Systolic 412 878 676  Diastolic 72 84 77  Pulse 79 77 80     Physical Exam Cardiovascular:     Rate and Rhythm: Normal rate and regular rhythm.     Pulses: Intact distal pulses.     Heart sounds: Murmur heard.   Early systolic murmur is present with a grade of 2/6 at the upper right sternal border. No gallop.   Pulmonary:     Effort: Pulmonary effort is normal.     Breath sounds: Normal breath sounds.  Abdominal:     General: Bowel sounds are normal.     Palpations: Abdomen is soft.  Musculoskeletal:  Cervical back: Neck supple.     Right lower leg: No edema.     Left lower leg: No edema.    Laboratory examination:   No results for input(s): NA, K, CL, CO2, GLUCOSE, BUN, CREATININE, CALCIUM, GFRNONAA, GFRAA in the last 8760 hours. CrCl cannot be calculated (Patient's most recent lab result is older than the maximum 21 days allowed.).  CMP Latest Ref Rng & Units 09/20/2019 02/14/2009  Glucose 70 - 99 mg/dL 126(H) 102(H)  BUN 8 - 23 mg/dL 7(L) 9  Creatinine 0.44 - 1.00 mg/dL 0.78 0.73  Sodium 135 - 145 mmol/L 140 141  Potassium 3.5 - 5.1 mmol/L 3.8 4.0  Chloride 98 - 111 mmol/L 106 108  CO2 22 - 32 mmol/L 23 28  Calcium 8.9 - 10.3 mg/dL 9.4 10.5   CBC Latest Ref Rng & Units 09/20/2019 02/14/2009  WBC 4.0 - 10.5 K/uL 10.1 8.0  Hemoglobin 12.0 - 15.0 g/dL  11.1(L) 11.6(L)  Hematocrit 36.0 - 46.0 % 35.3(L) 35.8(L)  Platelets 150 - 400 K/uL 536(H) 370     External labs:   Labs 11/09/2020:  Total cholesterol 158, triglycerides 70, HDL 61, LDL 84.  Amylase normal.  Hb 11.2/HCT 35.5, platelets 484, mildly elevated.   BUN 11, creatinine 0.71, EGFR 82/100 mL, potassium 3.7.  CMP otherwise normal.  Vitamin D 46.0. Serum glucose 88 mg, A1c 6.2%.  Medications and allergies   Allergies  Allergen Reactions  . Other Hives and Anaphylaxis  . Peanut-Containing Drug Products Shortness Of Breath  . Iohexol      Code: HIVES, Desc: patient claims hive from iv contrast. she drank omnipaque laced water oral contrast before informing anyone of an allergy (no problem from the oral at this time). no record of any enhancable procedures done in our system., Onset Date: [id number]   . Latex Swelling  . Methocarbamol Swelling  . Shellfish Allergy Other (See Comments)    Mouth and Throat Swelling.  . Triamcinolone Other (See Comments)  . Hydrochlorothiazide Rash    Current Meds  Medication Sig  . albuterol (PROVENTIL HFA;VENTOLIN HFA) 108 (90 BASE) MCG/ACT inhaler Inhale 2 puffs into the lungs every 6 (six) hours as needed for wheezing or shortness of breath (wheezing).  Marland Kitchen albuterol (PROVENTIL) (2.5 MG/3ML) 0.083% nebulizer solution Take 2.5 mg by nebulization every 6 (six) hours as needed for wheezing or shortness of breath (wheezing).  Marland Kitchen amLODipine (NORVASC) 5 MG tablet Take 5 mg by mouth daily.  . clobetasol cream (TEMOVATE) 1.66 % Apply 1 application topically as needed.  . cyclobenzaprine (FLEXERIL) 10 MG tablet Take 10 mg by mouth as needed.  Marland Kitchen EPINEPHrine 0.3 mg/0.3 mL IJ SOAJ injection epinephrine 0.3 mg/0.3 mL injection, auto-injector  . Hyoscyamine Sulfate SL 0.125 MG SUBL Place 1 tablet under the tongue as needed.  . mupirocin ointment (BACTROBAN) 2 % Apply 1 application topically as needed.  . pantoprazole (PROTONIX) 40 MG tablet 1 tablet   . zolpidem (AMBIEN CR) 12.5 MG CR tablet Take 6.25 mg by mouth at bedtime as needed for sleep (sleep).     Radiology:   No results found.   Cardiac Studies:   Exercise treadmill stress test 01/05/2021: Exercise treadmill stress test performed using Bruce protocol.  Patient reached 10.2 METS, and 106% of age predicted maximum heart rate.  Exercise capacity was excellent. No chest pain reported.  Normal heart rate and hemodynamic response. Stress EKG revealed no ischemic changes. Low risk study.  Echocardiogram 12/14/2020: Left ventricle cavity is normal  in size and wall thickness. Normal global wall motion. Normal LV systolic function with EF 65%. Normal diastolic filling pattern.  No significant valvular abnormality. No evidence of pulmonary hypertension. No significant change compared to previous study on 10/06/2017.   EKG:     EKG 12/04/2020: Normal sinus rhythm at rate of 70 bpm, leftward axis.  Otherwise normal EKG.    Assessment     ICD-10-CM   1. Atypical chest pain  R07.89   2. Dyspnea on exertion  R06.00       No orders of the defined types were placed in this encounter.  No orders of the defined types were placed in this encounter.   Recommendations:   Sandra Carr is a 65 y.o. African-American female with hypertension, hyperglycemia, remote reactive airway disease, since January 2022, has been having abdominal discomfort and frequent episodes of belching.  She has had extensive GI evaluation and eventually referred for cardiac evaluation in view of chest discomfort and dyspnea on exertion.  Her chest pain symptoms are clearly atypical and suggest a GI etiology however in view of hyperglycemia and hypertension and African-American heritage has risk factors, I performed routine treadmill stress testing which she has excellent exercise tolerance without ST-T wave changes of ischemia.  Low risk stress test.  Also echocardiogram does not reveal any significant structural  abnormality or aortic abnormality.  I simply reassured her, I will see her back on a as needed basis.    Adrian Prows, MD, FACC 01/09/2021, 4:08 PM Office: [phone]

## 2021-01-19 ENCOUNTER — Encounter (HOSPITAL_BASED_OUTPATIENT_CLINIC_OR_DEPARTMENT_OTHER): Payer: Self-pay | Admitting: *Deleted

## 2021-01-19 ENCOUNTER — Emergency Department (HOSPITAL_BASED_OUTPATIENT_CLINIC_OR_DEPARTMENT_OTHER): Payer: Medicare Other

## 2021-01-19 ENCOUNTER — Emergency Department (HOSPITAL_BASED_OUTPATIENT_CLINIC_OR_DEPARTMENT_OTHER)
Admission: EM | Admit: 2021-01-19 | Discharge: 2021-01-19 | Disposition: A | Discharge status: Home / Self Care | Payer: Medicare Other | Attending: Emergency Medicine | Admitting: Emergency Medicine

## 2021-01-19 ENCOUNTER — Other Ambulatory Visit: Payer: Self-pay

## 2021-01-19 DIAGNOSIS — Z20822 Contact with and (suspected) exposure to covid-19: Secondary | ICD-10-CM | POA: Diagnosis not present

## 2021-01-19 DIAGNOSIS — Z7951 Long term (current) use of inhaled steroids: Secondary | ICD-10-CM | POA: Diagnosis not present

## 2021-01-19 DIAGNOSIS — E876 Hypokalemia: Secondary | ICD-10-CM | POA: Diagnosis not present

## 2021-01-19 DIAGNOSIS — Z87891 Personal history of nicotine dependence: Secondary | ICD-10-CM | POA: Insufficient documentation

## 2021-01-19 DIAGNOSIS — J4521 Mild intermittent asthma with (acute) exacerbation: Secondary | ICD-10-CM | POA: Diagnosis not present

## 2021-01-19 DIAGNOSIS — Z91010 Allergy to peanuts: Secondary | ICD-10-CM | POA: Insufficient documentation

## 2021-01-19 DIAGNOSIS — Z91040 Latex allergy status: Secondary | ICD-10-CM | POA: Diagnosis not present

## 2021-01-19 DIAGNOSIS — R0602 Shortness of breath: Secondary | ICD-10-CM | POA: Diagnosis present

## 2021-01-19 LAB — CBC WITH DIFFERENTIAL/PLATELET
Abs Immature Granulocytes: 0.1 10*3/uL — ABNORMAL HIGH (ref 0.00–0.07)
Basophils Absolute: 0.1 10*3/uL (ref 0.0–0.1)
Basophils Relative: 1 %
Eosinophils Absolute: 0.6 10*3/uL — ABNORMAL HIGH (ref 0.0–0.5)
Eosinophils Relative: 4 %
HCT: 34.4 % — ABNORMAL LOW (ref 36.0–46.0)
Hemoglobin: 10.9 g/dL — ABNORMAL LOW (ref 12.0–15.0)
Immature Granulocytes: 1 %
Lymphocytes Relative: 39 %
Lymphs Abs: 5.2 10*3/uL — ABNORMAL HIGH (ref 0.7–4.0)
MCH: 21.5 pg — ABNORMAL LOW (ref 26.0–34.0)
MCHC: 31.7 g/dL (ref 30.0–36.0)
MCV: 67.7 fL — ABNORMAL LOW (ref 80.0–100.0)
Monocytes Absolute: 0.9 10*3/uL (ref 0.1–1.0)
Monocytes Relative: 7 %
Neutro Abs: 6.7 10*3/uL (ref 1.7–7.7)
Neutrophils Relative %: 48 %
Platelets: 482 10*3/uL — ABNORMAL HIGH (ref 150–400)
RBC: 5.08 MIL/uL (ref 3.87–5.11)
RDW: 18.6 % — ABNORMAL HIGH (ref 11.5–15.5)
WBC: 13.6 10*3/uL — ABNORMAL HIGH (ref 4.0–10.5)
nRBC: 0 % (ref 0.0–0.2)

## 2021-01-19 LAB — RESP PANEL BY RT-PCR (FLU A&B, COVID) ARPGX2
Influenza A by PCR: NEGATIVE
Influenza B by PCR: NEGATIVE
SARS Coronavirus 2 by RT PCR: NEGATIVE

## 2021-01-19 LAB — BASIC METABOLIC PANEL
Anion gap: 9 (ref 5–15)
BUN: 14 mg/dL (ref 8–23)
CO2: 24 mmol/L (ref 22–32)
Calcium: 9 mg/dL (ref 8.9–10.3)
Chloride: 107 mmol/L (ref 98–111)
Creatinine, Ser: 0.87 mg/dL (ref 0.44–1.00)
GFR, Estimated: >60 mL/min (ref >60)
Glucose, Bld: 133 mg/dL — ABNORMAL HIGH (ref 70–99)
Potassium: 2.7 mmol/L — CL (ref 3.5–5.1)
Sodium: 140 mmol/L (ref 135–145)

## 2021-01-19 LAB — MAGNESIUM: Magnesium: 1.8 mg/dL (ref 1.7–2.4)

## 2021-01-19 MED ORDER — METHYLPREDNISOLONE SODIUM SUCC 125 MG IJ SOLR
125.0000 mg | Freq: Once | INTRAMUSCULAR | Status: AC
  Administered 2021-01-19: 125 mg via INTRAVENOUS
  Filled 2021-01-19: qty 2

## 2021-01-19 MED ORDER — POTASSIUM CHLORIDE ER 20 MEQ PO TBCR: 20.0000 meq | EXTENDED_RELEASE_TABLET | Freq: Every day | ORAL | 0 refills | Status: DC

## 2021-01-19 MED ORDER — POTASSIUM CHLORIDE CRYS ER 20 MEQ PO TBCR
40.0000 meq | EXTENDED_RELEASE_TABLET | Freq: Once | ORAL | Status: AC
  Administered 2021-01-19: 40 meq via ORAL
  Filled 2021-01-19: qty 2

## 2021-01-19 MED ORDER — IPRATROPIUM-ALBUTEROL 0.5-2.5 (3) MG/3ML IN SOLN
3.0000 mL | Freq: Once | RESPIRATORY_TRACT | Status: AC
  Administered 2021-01-19: 3 mL via RESPIRATORY_TRACT
  Filled 2021-01-19: qty 3

## 2021-01-19 MED ORDER — BUDESONIDE 180 MCG/ACT IN AEPB
2.0000 | INHALATION_SPRAY | Freq: Every day | RESPIRATORY_TRACT | 0 refills | Status: DC
Start: 2021-01-19 — End: 2021-02-08

## 2021-01-19 NOTE — ED Triage Notes (Signed)
Asthma. Cough x 5 days. She was started on antibiotics and a tapered dose of steroids with no improvement.

## 2021-01-19 NOTE — ED Provider Notes (Signed)
[facility]PART[facility] Provider Note   CSN: [id number] Arrival date & time: 01/19/21  1601     History Chief Complaint  Patient presents with  . Asthma    Sandra Carr is a 65 y.o. female.  Pt presents to the ED today with sob and cough.  Pt has a hx of asthma and had a bad asthma attack on 5/7.  She was helping her daughter move and was spraying Lysol and bringing up a lot of dust.   She did see her Dr. Dwyane Dee at [facility] on 5/8 for the sob.  She was put on Doxy and Prednisone.  Pt has nebs at home that she's been using.  She does not feel like she is getting better.  She has been vaccinated + boosted for Covid.  No known sick exposures.  No f/c.        Past Medical History:  Diagnosis Date  . Asthma     Patient Active Problem List   Diagnosis Date Noted  . Low back pain 11/29/2019  . Pain in right knee 11/16/2019  . Subjective tinnitus, bilateral 10/09/2017  . Tachycardia 01/25/2016    Past Surgical History:  Procedure Laterality Date  . ABDOMINAL HYSTERECTOMY    . FOOT SURGERY Left      OB History   No obstetric history on file.     Family History  Problem Relation Age of Onset  . Breast cancer Cousin   . Hypertension Mother   . Diabetes Mother   . Hypertension Father   . Heart failure Brother   . Heart disease Brother     Social History   Tobacco Use  . Smoking status: Former Smoker    Packs/day: 0.25    Years: 20.00    Pack years: 5.00    Types: Cigarettes    Quit date: 1998    Years since quitting: 24.3  . Smokeless tobacco: Never Used  Vaping Use  . Vaping Use: Never used  Substance Use Topics  . Alcohol use: Yes    Comment: occ wine  . Drug use: Never    Home Medications Prior to Admission medications   Medication Sig Start Date End Date Taking? Authorizing Provider  budesonide (PULMICORT) 180 MCG/ACT inhaler Inhale 2 puffs into the lungs daily. 01/19/21  Yes [name], MD  potassium chloride 20 MEQ TBCR  Take 20 mEq by mouth daily. 01/19/21  Yes [name], MD  albuterol (PROVENTIL HFA;VENTOLIN HFA) 108 (90 BASE) MCG/ACT inhaler Inhale 2 puffs into the lungs every 6 (six) hours as needed for wheezing or shortness of breath (wheezing).    [provider]  albuterol (PROVENTIL) (2.5 MG/3ML) 0.083% nebulizer solution Take 2.5 mg by nebulization every 6 (six) hours as needed for wheezing or shortness of breath (wheezing).    [provider]  amLODipine (NORVASC) 5 MG tablet Take 5 mg by mouth daily.    [provider]  clobetasol cream (TEMOVATE) 0.93 % Apply 1 application topically as needed.    [provider]  cyclobenzaprine (FLEXERIL) 10 MG tablet Take 10 mg by mouth as needed. 06/12/20   [provider]  EPINEPHrine 0.3 mg/0.3 mL IJ SOAJ injection epinephrine 0.3 mg/0.3 mL injection, auto-injector    [provider]  Hyoscyamine Sulfate SL 0.125 MG SUBL Place 1 tablet under the tongue as needed. 11/28/20   [provider]  mupirocin ointment (BACTROBAN) 2 % Apply 1 application topically as needed.    [provider]  pantoprazole (PROTONIX) 40 MG tablet 1 tablet 11/28/20   [provider]  zolpidem (AMBIEN CR) 12.5 MG CR tablet Take 6.25 mg by mouth at bedtime as needed for sleep (sleep).    [provider]    Allergies    Other, Peanut-containing drug products, Iohexol, Latex, Methocarbamol, Shellfish allergy, Triamcinolone, and Hydrochlorothiazide  Review of Systems   Review of Systems  Respiratory: Positive for cough, shortness of breath and wheezing.   All other systems reviewed and are negative.   Physical Exam Updated Vital Signs BP 134/82   Pulse 81   Temp 98.4 F (36.9 C) (Oral)   Resp 18   Ht 5\' 4"  (1.626 m)   Wt 67.5 kg   LMP 05/18/1993   SpO2 100%   BMI 25.54 kg/m   Physical Exam Vitals and nursing note reviewed.  Constitutional:      Appearance: Normal appearance.  HENT:      Head: Normocephalic and atraumatic.     Right Ear: External ear normal.     Left Ear: External ear normal.     Nose: Nose normal.     Mouth/Throat:     Mouth: Mucous membranes are moist.     Pharynx: Oropharynx is clear.  Eyes:     Extraocular Movements: Extraocular movements intact.     Conjunctiva/sclera: Conjunctivae normal.     Pupils: Pupils are equal, round, and reactive to light.  Cardiovascular:     Rate and Rhythm: Normal rate and regular rhythm.     Pulses: Normal pulses.     Heart sounds: Normal heart sounds.  Pulmonary:     Breath sounds: Wheezing present.  Abdominal:     General: Abdomen is flat. Bowel sounds are normal.     Palpations: Abdomen is soft.  Musculoskeletal:        General: Normal range of motion.     Cervical back: Normal range of motion and neck supple.  Skin:    General: Skin is warm.     Capillary Refill: Capillary refill takes less than 2 seconds.  Neurological:     General: No focal deficit present.     Mental Status: She is alert and oriented to person, place, and time.  Psychiatric:        Mood and Affect: Mood normal.        Behavior: Behavior normal.        Thought Content: Thought content normal.        Judgment: Judgment normal.     ED Results / Procedures / Treatments   Labs (all labs ordered are listed, but only abnormal results are displayed) Labs Reviewed  BASIC METABOLIC PANEL - Abnormal; Notable for the following components:      Result Value   Potassium 2.7 (*)    Glucose, Bld 133 (*)    All other components within normal limits  CBC WITH DIFFERENTIAL/PLATELET - Abnormal; Notable for the following components:   WBC 13.6 (*)    Hemoglobin 10.9 (*)    HCT 34.4 (*)    MCV 67.7 (*)    MCH 21.5 (*)    RDW 18.6 (*)    Platelets 482 (*)    Lymphs Abs 5.2 (*)    Eosinophils Absolute 0.6 (*)    Abs Immature Granulocytes 0.10 (*)    All other components within normal limits  RESP PANEL BY RT-PCR (FLU A&B, COVID) [id number]   MAGNESIUM    EKG None  Radiology DG Chest Portable  1 View  Result Date: 01/19/2021 CLINICAL DATA:  Shortness of breath EXAM: PORTABLE CHEST 1 VIEW COMPARISON:  07/22/2018 FINDINGS: The heart size and mediastinal contours are within normal limits. Both lungs are clear. Surgical hardware in the cervical spine. IMPRESSION: No active disease. Electronically Signed   By: Donavan Foil M.D.   On: 01/19/2021 20:01    Procedures Procedures   Medications Ordered in ED Medications  ipratropium-albuterol (DUONEB) 0.5-2.5 (3) MG/3ML nebulizer solution 3 mL (3 mLs Nebulization Given 01/19/21 2011)  methylPREDNISolone sodium succinate (SOLU-MEDROL) 125 mg/2 mL injection 125 mg (125 mg Intravenous Given 01/19/21 1936)  potassium chloride SA (KLOR-CON) CR tablet 40 mEq (40 mEq Oral Given 01/19/21 2019)    ED Course  I have reviewed the triage vital signs and the nursing notes.  Pertinent labs & imaging results that were available during my care of the patient were reviewed by me and considered in my medical decision making (see chart for details).    MDM Rules/Calculators/A&P                          Covid neg.  She is feeling better after a duoneb.  She is hypokalemic with a K of 2.7.  She is given 40 meq orally here.  CXR neg.  Pt will be d/c with a pulmicort inhaler and instructed to return if worse.  F/u with pcp.  Reniya Mcclees was evaluated in Emergency Department on 01/19/2021 for the symptoms described in the history of present illness. She was evaluated in the context of the global COVID-19 pandemic, which necessitated consideration that the patient might be at risk for infection with the SARS-CoV-2 virus that causes COVID-19. Institutional protocols and algorithms that pertain to the evaluation of patients at risk for COVID-19 are in a state of rapid change based on information released by regulatory bodies including the CDC and federal and state organizations. These policies and algorithms  were followed during the patient's care in the ED.  Final Clinical Impression(s) / ED Diagnoses Final diagnoses:  Mild intermittent asthma with exacerbation  Hypokalemia    Rx / DC Orders ED Discharge Orders         Ordered    potassium chloride 20 MEQ TBCR  Daily        01/19/21 2106    budesonide (PULMICORT) 180 MCG/ACT inhaler  Daily        01/19/21 2106           [name], MD 01/19/21 2107

## 2021-02-06 ENCOUNTER — Other Ambulatory Visit: Payer: Self-pay

## 2021-02-06 ENCOUNTER — Emergency Department (HOSPITAL_COMMUNITY): Payer: Medicare Other

## 2021-02-06 ENCOUNTER — Inpatient Hospital Stay (HOSPITAL_COMMUNITY)
Admission: EM | Admit: 2021-02-06 | Discharge: 2021-02-08 | DRG: 203 | Disposition: A | Discharge status: Home / Self Care | Payer: Medicare Other | Attending: Internal Medicine | Admitting: Internal Medicine

## 2021-02-06 ENCOUNTER — Encounter (HOSPITAL_COMMUNITY): Payer: Self-pay | Admitting: *Deleted

## 2021-02-06 DIAGNOSIS — Z79899 Other long term (current) drug therapy: Secondary | ICD-10-CM | POA: Diagnosis not present

## 2021-02-06 DIAGNOSIS — R062 Wheezing: Secondary | ICD-10-CM | POA: Diagnosis not present

## 2021-02-06 DIAGNOSIS — I10 Essential (primary) hypertension: Secondary | ICD-10-CM | POA: Diagnosis present

## 2021-02-06 DIAGNOSIS — J45901 Unspecified asthma with (acute) exacerbation: Secondary | ICD-10-CM | POA: Diagnosis present

## 2021-02-06 DIAGNOSIS — Z91010 Allergy to peanuts: Secondary | ICD-10-CM | POA: Diagnosis not present

## 2021-02-06 DIAGNOSIS — D509 Iron deficiency anemia, unspecified: Secondary | ICD-10-CM | POA: Diagnosis present

## 2021-02-06 DIAGNOSIS — Z91040 Latex allergy status: Secondary | ICD-10-CM | POA: Diagnosis not present

## 2021-02-06 DIAGNOSIS — Z87891 Personal history of nicotine dependence: Secondary | ICD-10-CM | POA: Diagnosis not present

## 2021-02-06 DIAGNOSIS — Z888 Allergy status to other drugs, medicaments and biological substances status: Secondary | ICD-10-CM

## 2021-02-06 DIAGNOSIS — Z7951 Long term (current) use of inhaled steroids: Secondary | ICD-10-CM

## 2021-02-06 DIAGNOSIS — Z91013 Allergy to seafood: Secondary | ICD-10-CM | POA: Diagnosis not present

## 2021-02-06 DIAGNOSIS — D649 Anemia, unspecified: Secondary | ICD-10-CM | POA: Diagnosis present

## 2021-02-06 DIAGNOSIS — Z8249 Family history of ischemic heart disease and other diseases of the circulatory system: Secondary | ICD-10-CM | POA: Diagnosis not present

## 2021-02-06 DIAGNOSIS — K219 Gastro-esophageal reflux disease without esophagitis: Secondary | ICD-10-CM | POA: Diagnosis present

## 2021-02-06 DIAGNOSIS — Z833 Family history of diabetes mellitus: Secondary | ICD-10-CM | POA: Diagnosis not present

## 2021-02-06 DIAGNOSIS — R0602 Shortness of breath: Secondary | ICD-10-CM

## 2021-02-06 DIAGNOSIS — J4541 Moderate persistent asthma with (acute) exacerbation: Secondary | ICD-10-CM | POA: Diagnosis present

## 2021-02-06 DIAGNOSIS — E876 Hypokalemia: Secondary | ICD-10-CM | POA: Diagnosis present

## 2021-02-06 DIAGNOSIS — E538 Deficiency of other specified B group vitamins: Secondary | ICD-10-CM | POA: Diagnosis present

## 2021-02-06 DIAGNOSIS — Z20822 Contact with and (suspected) exposure to covid-19: Secondary | ICD-10-CM | POA: Diagnosis present

## 2021-02-06 LAB — CBC WITH DIFFERENTIAL/PLATELET
Abs Immature Granulocytes: 0.03 10*3/uL (ref 0.00–0.07)
Basophils Absolute: 0.1 10*3/uL (ref 0.0–0.1)
Basophils Relative: 1 %
Eosinophils Absolute: 0.8 10*3/uL — ABNORMAL HIGH (ref 0.0–0.5)
Eosinophils Relative: 9 %
HCT: 35.9 % — ABNORMAL LOW (ref 36.0–46.0)
Hemoglobin: 11.2 g/dL — ABNORMAL LOW (ref 12.0–15.0)
Immature Granulocytes: 0 %
Lymphocytes Relative: 26 %
Lymphs Abs: 2.4 10*3/uL (ref 0.7–4.0)
MCH: 21.2 pg — ABNORMAL LOW (ref 26.0–34.0)
MCHC: 31.2 g/dL (ref 30.0–36.0)
MCV: 67.9 fL — ABNORMAL LOW (ref 80.0–100.0)
Monocytes Absolute: 0.6 10*3/uL (ref 0.1–1.0)
Monocytes Relative: 6 %
Neutro Abs: 5.4 10*3/uL (ref 1.7–7.7)
Neutrophils Relative %: 58 %
Platelets: 394 10*3/uL (ref 150–400)
RBC: 5.29 MIL/uL — ABNORMAL HIGH (ref 3.87–5.11)
RDW: 19.4 % — ABNORMAL HIGH (ref 11.5–15.5)
WBC: 9.3 10*3/uL (ref 4.0–10.5)
nRBC: 0 % (ref 0.0–0.2)

## 2021-02-06 LAB — RESP PANEL BY RT-PCR (FLU A&B, COVID) ARPGX2
Influenza A by PCR: NEGATIVE
Influenza B by PCR: NEGATIVE
SARS Coronavirus 2 by RT PCR: NEGATIVE

## 2021-02-06 LAB — TROPONIN I (HIGH SENSITIVITY)
Troponin I (High Sensitivity): <2 ng/L (ref <18)
Troponin I (High Sensitivity): 2 ng/L (ref <18)

## 2021-02-06 LAB — COMPREHENSIVE METABOLIC PANEL
ALT: 12 U/L (ref 0–44)
AST: 12 U/L — ABNORMAL LOW (ref 15–41)
Albumin: 4.4 g/dL (ref 3.5–5.0)
Alkaline Phosphatase: 61 U/L (ref 38–126)
Anion gap: 9 (ref 5–15)
BUN: 9 mg/dL (ref 8–23)
CO2: 24 mmol/L (ref 22–32)
Calcium: 9.5 mg/dL (ref 8.9–10.3)
Chloride: 107 mmol/L (ref 98–111)
Creatinine, Ser: 0.59 mg/dL (ref 0.44–1.00)
GFR, Estimated: >60 mL/min (ref >60)
Glucose, Bld: 83 mg/dL (ref 70–99)
Potassium: 2.8 mmol/L — ABNORMAL LOW (ref 3.5–5.1)
Sodium: 140 mmol/L (ref 135–145)
Total Bilirubin: 1 mg/dL (ref 0.3–1.2)
Total Protein: 8.1 g/dL (ref 6.5–8.1)

## 2021-02-06 LAB — BRAIN NATRIURETIC PEPTIDE: B Natriuretic Peptide: 23.5 pg/mL (ref 0.0–100.0)

## 2021-02-06 LAB — MAGNESIUM: Magnesium: 2.2 mg/dL (ref 1.7–2.4)

## 2021-02-06 MED ORDER — POTASSIUM CHLORIDE CRYS ER 20 MEQ PO TBCR
40.0000 meq | EXTENDED_RELEASE_TABLET | Freq: Once | ORAL | Status: DC
  Filled 2021-02-06: qty 2

## 2021-02-06 MED ORDER — AEROCHAMBER Z-STAT PLUS/MEDIUM MISC
1.0000 | Freq: Once | Status: AC
  Administered 2021-02-06: 1
  Filled 2021-02-06: qty 1

## 2021-02-06 MED ORDER — POTASSIUM CHLORIDE 10 MEQ/100ML IV SOLN
10.0000 meq | INTRAVENOUS | Status: AC
  Administered 2021-02-07 (×3): 10 meq via INTRAVENOUS
  Filled 2021-02-06 (×3): qty 100

## 2021-02-06 MED ORDER — IPRATROPIUM BROMIDE HFA 17 MCG/ACT IN AERS
2.0000 | INHALATION_SPRAY | Freq: Once | RESPIRATORY_TRACT | Status: AC
  Administered 2021-02-06: 2 via RESPIRATORY_TRACT
  Filled 2021-02-06: qty 12.9

## 2021-02-06 MED ORDER — ALBUTEROL SULFATE HFA 108 (90 BASE) MCG/ACT IN AERS
4.0000 | INHALATION_SPRAY | Freq: Once | RESPIRATORY_TRACT | Status: AC
  Administered 2021-02-06: 4 via RESPIRATORY_TRACT
  Filled 2021-02-06: qty 6.7

## 2021-02-06 MED ORDER — ALBUTEROL (5 MG/ML) CONTINUOUS INHALATION SOLN
10.0000 mg/h | INHALATION_SOLUTION | Freq: Once | RESPIRATORY_TRACT | Status: AC
  Administered 2021-02-06: 10 mg/h via RESPIRATORY_TRACT
  Filled 2021-02-06: qty 20

## 2021-02-06 MED ORDER — MAGNESIUM SULFATE 2 GM/50ML IV SOLN
2.0000 g | Freq: Once | INTRAVENOUS | Status: AC
  Administered 2021-02-06: 2 g via INTRAVENOUS
  Filled 2021-02-06: qty 50

## 2021-02-06 MED ORDER — POTASSIUM CHLORIDE CRYS ER 20 MEQ PO TBCR
40.0000 meq | EXTENDED_RELEASE_TABLET | Freq: Once | ORAL | Status: AC
  Administered 2021-02-06: 40 meq via ORAL
  Filled 2021-02-06: qty 2

## 2021-02-06 MED ORDER — METHYLPREDNISOLONE SODIUM SUCC 125 MG IJ SOLR
125.0000 mg | Freq: Once | INTRAMUSCULAR | Status: AC
  Administered 2021-02-06: 125 mg via INTRAVENOUS
  Filled 2021-02-06: qty 2

## 2021-02-06 MED ORDER — ALBUTEROL SULFATE HFA 108 (90 BASE) MCG/ACT IN AERS: 6.0000 | INHALATION_SPRAY | Freq: Once | RESPIRATORY_TRACT | Status: DC

## 2021-02-06 NOTE — ED Provider Notes (Signed)
[place] DEPT Provider Note   CSN: [id number] Arrival date & time: 02/06/21  1247     History Chief Complaint  Patient presents with  . Asthma    Sandra Carr is a 65 y.o. female with past medical history of asthma presents today for evaluation of worsening shortness of breath. She states that in April she was spraying Lysol and since then has had waxing and waning shortness of breath. She has been seen by Dr. Einar Gip with cardiology since then and had reassuring stress tests.  She has not seen pulm.    She was seen in the ER on 01/19/21 and started on pulmicort daily.  She reports that she has had worsening   HPI     Past Medical History:  Diagnosis Date  . Asthma     Patient Active Problem List   Diagnosis Date Noted  . Asthma exacerbation 02/06/2021  . Low back pain 11/29/2019  . Pain in right knee 11/16/2019  . Subjective tinnitus, bilateral 10/09/2017  . Tachycardia 01/25/2016    Past Surgical History:  Procedure Laterality Date  . ABDOMINAL HYSTERECTOMY    . FOOT SURGERY Left      OB History   No obstetric history on file.     Family History  Problem Relation Age of Onset  . Breast cancer Cousin   . Hypertension Mother   . Diabetes Mother   . Hypertension Father   . Heart failure Brother   . Heart disease Brother     Social History   Tobacco Use  . Smoking status: Former Smoker    Packs/day: 0.25    Years: 20.00    Pack years: 5.00    Types: Cigarettes    Quit date: 1998    Years since quitting: 24.4  . Smokeless tobacco: Never Used  Vaping Use  . Vaping Use: Never used  Substance Use Topics  . Alcohol use: Yes    Comment: occ wine  . Drug use: Never    Home Medications Prior to Admission medications   Medication Sig Start Date End Date Taking? Authorizing Provider  albuterol (PROVENTIL HFA;VENTOLIN HFA) 108 (90 BASE) MCG/ACT inhaler Inhale 2 puffs into the lungs every 6 (six) hours as needed for  wheezing or shortness of breath (wheezing).    [provider]  albuterol (PROVENTIL) (2.5 MG/3ML) 0.083% nebulizer solution Take 2.5 mg by nebulization every 6 (six) hours as needed for wheezing or shortness of breath (wheezing).    [provider]  amLODipine (NORVASC) 5 MG tablet Take 5 mg by mouth daily.    [provider]  budesonide (PULMICORT) 180 MCG/ACT inhaler Inhale 2 puffs into the lungs daily. 01/19/21   Sandra Pence, MD  clobetasol cream (TEMOVATE) 9.81 % Apply 1 application topically as needed.    [provider]  cyclobenzaprine (FLEXERIL) 10 MG tablet Take 10 mg by mouth as needed. 06/12/20   [provider]  EPINEPHrine 0.3 mg/0.3 mL IJ SOAJ injection epinephrine 0.3 mg/0.3 mL injection, auto-injector    [provider]  Hyoscyamine Sulfate SL 0.125 MG SUBL Place 1 tablet under the tongue as needed. 11/28/20   [provider]  mupirocin ointment (BACTROBAN) 2 % Apply 1 application topically as needed.    [provider]  pantoprazole (PROTONIX) 40 MG tablet 1 tablet 11/28/20   [provider]  potassium chloride 20 MEQ TBCR Take 20 mEq by mouth daily. 01/19/21   Sandra Pence, MD  zolpidem [facility]  CR) 12.5 MG CR tablet Take 6.25 mg by mouth at bedtime as needed for sleep (sleep).    [provider]    Allergies    Other, Peanut-containing drug products, Iohexol, Latex, Methocarbamol, Shellfish allergy, Triamcinolone, and Hydrochlorothiazide  Review of Systems   Review of Systems  Constitutional: Negative for chills and fever.  Eyes: Negative for visual disturbance.  Respiratory: Positive for chest tightness and shortness of breath.   Cardiovascular: Positive for chest pain. Negative for palpitations and leg swelling.  Gastrointestinal: Negative for abdominal pain, diarrhea, nausea and vomiting.  Genitourinary: Negative for dysuria.  Musculoskeletal: Negative for back pain.   Neurological: Negative for weakness and headaches.  All other systems reviewed and are negative.   Physical Exam Updated Vital Signs BP 135/76   Pulse (!) 117   Temp 98.2 F (36.8 C) (Oral)   Resp 15   Ht 5\' 4"  (1.626 m)   Wt 66.2 kg   LMP 05/18/1993   SpO2 95%   BMI 25.06 kg/m   Physical Exam Vitals and nursing note reviewed.  Constitutional:      Appearance: She is well-developed.     Comments: Increased work of breathing  HENT:     Head: Normocephalic and atraumatic.  Eyes:     Conjunctiva/sclera: Conjunctivae normal.  Cardiovascular:     Rate and Rhythm: Normal rate and regular rhythm.     Heart sounds: Normal heart sounds. No murmur heard.   Pulmonary:     Effort: Respiratory distress present.     Breath sounds: Wheezing (diffuse, bilaterally) present.  Abdominal:     Palpations: Abdomen is soft.     Tenderness: There is no abdominal tenderness.  Musculoskeletal:     Cervical back: Neck supple.     Right lower leg: No edema.     Left lower leg: No edema.  Skin:    General: Skin is warm and dry.  Neurological:     Mental Status: She is alert.     Comments: Patient is awake and alert, speech is not slurred, moves all 4 extremities spontaneously.  Psychiatric:        Mood and Affect: Mood normal.        Behavior: Behavior normal.     ED Results / Procedures / Treatments   Labs (all labs ordered are listed, but only abnormal results are displayed) Labs Reviewed  COMPREHENSIVE METABOLIC PANEL - Abnormal; Notable for the following components:      Result Value   Potassium 2.8 (*)    AST 12 (*)    All other components within normal limits  CBC WITH DIFFERENTIAL/PLATELET - Abnormal; Notable for the following components:   RBC 5.29 (*)    Hemoglobin 11.2 (*)    HCT 35.9 (*)    MCV 67.9 (*)    MCH 21.2 (*)    RDW 19.4 (*)    Eosinophils Absolute 0.8 (*)    All other components within normal limits  RESP PANEL BY RT-PCR (FLU A&B, COVID) [id number]   BRAIN NATRIURETIC PEPTIDE  MAGNESIUM  D-DIMER, QUANTITATIVE  BLOOD GAS, VENOUS  TROPONIN I (HIGH SENSITIVITY)  TROPONIN I (HIGH SENSITIVITY)    EKG EKG Interpretation  Date/Time:  Tuesday Feb 06 2021 17:00:25 EDT Ventricular Rate:  84 PR Interval:  169 QRS Duration: 94 QT Interval:  375 QTC Calculation: 444 R Axis:   -27 Text Interpretation: Sinus rhythm Borderline left axis deviation Similar to previous Confirmed by Lavenia Atlas ([id number]) on 02/06/2021 5:27:14 PM  Radiology DG Chest Port 1 View  Result Date: 02/06/2021 CLINICAL DATA:  Shortness of breath. EXAM: PORTABLE CHEST 1 VIEW COMPARISON:  Jan 19, 2021. FINDINGS: The heart size and mediastinal contours are within normal limits. Both lungs are clear. The visualized skeletal structures are unremarkable. IMPRESSION: No active disease. Electronically Signed   By: Marijo Conception M.D.   On: 02/06/2021 16:43    Procedures .Critical Care Performed by: Lorin Glass, PA-C Authorized by: Lorin Glass, PA-C   Critical care provider statement:    Critical care time (minutes):  45   Critical care was necessary to treat or prevent imminent or life-threatening deterioration of the following conditions:  Respiratory failure   Critical care was time spent personally by me on the following activities:  Discussions with consultants, evaluation of patient's response to treatment, examination of patient, ordering and performing treatments and interventions, ordering and review of laboratory studies, ordering and review of radiographic studies, pulse oximetry, re-evaluation of patient's condition, obtaining history from patient or surrogate and review of old charts Comments:     Over three treatments and still in distress.      Medications Ordered in ED Medications  potassium chloride SA (KLOR-CON) CR tablet 40 mEq (has no administration in time range)  albuterol (VENTOLIN HFA) 108 (90 Base) MCG/ACT inhaler 4 puff (4  puffs Inhalation Given 02/06/21 1617)  aerochamber Z-Stat Plus/medium 1 each (1 each Other Given 02/06/21 1618)  ipratropium (ATROVENT HFA) inhaler 2 puff (2 puffs Inhalation Given 02/06/21 1623)  potassium chloride SA (KLOR-CON) CR tablet 40 mEq (40 mEq Oral Given 02/06/21 1813)  magnesium sulfate IVPB 2 g 50 mL (0 g Intravenous Stopped 02/06/21 2134)  methylPREDNISolone sodium succinate (SOLU-MEDROL) 125 mg/2 mL injection 125 mg (125 mg Intravenous Given 02/06/21 2014)  albuterol (PROVENTIL,VENTOLIN) solution continuous neb (10 mg/hr Nebulization Given 02/06/21 1954)    ED Course  I have reviewed the triage vital signs and the nursing notes.  Pertinent labs & imaging results that were available during my care of the patient were reviewed by me and considered in my medical decision making (see chart for details).  Clinical Course as of 02/06/21 2300  Tue Feb 06, 2021  1918 Patient is reevaluated, she is still very wheezy.  She monitors her peak flow on her own and is still only in the 200s. She states that she had felt slightly better originally however is feeling bad.  She has diffuse wheezes still.  Additional albuterol ordered. [EH]  2202 Patient is reevaluated, she still continues to have increased work of breathing, is tachypneic.  She finished her hour-long albuterol treatment and is now tachycardic. She is unable to speak in full sentences due to her dyspnea. I discussed admission with patient who is agreeable. [EH]  2254 I spoke with Dr. Roel Cluck who will see the patient for admission. [EH]    Clinical Course User Index [EH] Ollen Gross   MDM Rules/Calculators/A&P                         Patient is a 65 year old woman who presents today for evaluation of shortness of breath. She has had worsening and ongoing shortness of breath since about April when she was exposed to Lysol. On exam she has increased work of breathing, diffuse wheezes. Chest x-ray without acute  abnormalities.  Her potassium is low at 2.8, I suspect that a component of this is due to albuterol use.  This is orally repleted.  She is additionally given a dose of magnesium for bronchospasm. She does not have a significant leukocytosis, hemoglobin is slightly low at 11.2.  She was treated with multiple rounds of albuterol including a hour-long continuous treatment.  She was given Atrovent, IV Solu-Medrol, IV magnesium.  She was given oral potassium. After this patient was reevaluated and continued to be unable to speak in full sentences due to her dyspnea. Given that this seems to be a ongoing and worsening process along with patient's continued increased work of breathing and dyspnea recommended admission. Patient is agreeable for admission. I spoke with Dr. Roel Cluck who will see the patient for admission.  The patient appears reasonably stabilized for admission considering the current resources, flow, and capabilities available in the ED at this time, and I doubt any other [facility] requiring further screening and/or treatment in the ED prior to admission assuming timely admission and bed placement.  Note: Portions of this report may have been transcribed using voice recognition software. Every effort was made to ensure accuracy; however, inadvertent computerized transcription errors may be present  Final Clinical Impression(s) / ED Diagnoses Final diagnoses:  Wheezing  Shortness of breath    Rx / DC Orders ED Discharge Orders    None       Lorin Glass, PA-C 02/06/21 2315    Lorelle Gibbs, DO 02/06/21 2332

## 2021-02-06 NOTE — H&P (Signed)
Sandra Carr ZOX:[id number] DOB: February 26, 1956 DOA: 02/06/2021     PCP: Vernie Shanks, MD   Outpatient Specialists:   CARDS:   Dr. Einar Gip   GI  Dr. Mardella Layman    Patient arrived to ER on 02/06/21 at 1247 Referred by Attending Horton, Alvin Critchley, DO   Patient coming from: home Lives alone,       Chief Complaint:   Chief Complaint  Patient presents with  . Asthma    HPI: Sandra Carr is a 65 y.o. female with medical history significant of asthma, anemia    Presented with   Shortness of breath and wheezing for total of 6 weeks from 4/17 She was on steroids it helped but when she was done with steroids it resrtated Her symptoms have gotten worse and her PCP started her on steroids again her symptoms were ok at first but eventually she was on constant nebulizer with little help This morning it ws severe Reports wet cough 1 month ago she was using cleaners and after that started to have significant wheezing and shortness of breath She felt like mucus was stuck in her throat and after extensive cough she had severe spasm She saw her PCP in AM and he sent her to ER  She has also seen GI and cardiology for burping and was cleared She been checking her peak flow   and was measuring 150   Infectious risk factors:  Reports  shortness of breath     Has   been vaccinated against COVID  and boosted   Initial COVID TEST  NEGATIVE   Lab Results  Component Value Date   SARSCOV2NAA NEGATIVE 02/06/2021   [place] NEGATIVE 01/19/2021   [place] Not Detected 05/17/2020   [place] Not Detected 05/03/2020      Regarding pertinent Chronic problems:    Asthma - on home inhalers/ nebs                         Prior admission  Years ago      Chronic anemia - baseline hg Hemoglobin & Hematocrit  Recent Labs    01/19/21 1926 02/06/21 1638  HGB 10.9* 11.2*    While in ER: continious nebs Unable to speak in full sentence CXR neg     ED Triage Vitals  Enc Vitals Group      BP 02/06/21 1313 (!) 152/84     Pulse Rate 02/06/21 1313 86     Resp 02/06/21 1313 14     Temp 02/06/21 1313 98.3 F (36.8 C)     Temp Source 02/06/21 1313 Oral     SpO2 02/06/21 1313 100 %     Weight 02/06/21 1313 146 lb (66.2 kg)     Height 02/06/21 1313 5\' 4"  (1.626 m)     Head Circumference --      Peak Flow --      Pain Score 02/06/21 1320 0     Pain Loc --      Pain Edu? --      Excl. in [place]? --   TMAX(24)@     _________________________________________ Significant initial  Findings: Abnormal Labs Reviewed  COMPREHENSIVE METABOLIC PANEL - Abnormal; Notable for the following components:      Result Value   Potassium 2.8 (*)    AST 12 (*)    All other components within normal limits  CBC WITH DIFFERENTIAL/PLATELET - Abnormal; Notable for the following components:   RBC 5.29 (*)  Hemoglobin 11.2 (*)    HCT 35.9 (*)    MCV 67.9 (*)    MCH 21.2 (*)    RDW 19.4 (*)    Eosinophils Absolute 0.8 (*)    All other components within normal limits   ____________________________________________ Ordered   CXR -  NON acute   _________________________ Troponin 4 ECG: Ordered Personally reviewed by me showing: HR : 84 Rhythm:  NSR    no evidence of ischemic changes QTC 444 _______  The recent clinical data is shown below. Vitals:   02/06/21 2100 02/06/21 2130 02/06/21 2200 02/06/21 2230  BP: (!) 158/86 (!) 138/103 (!) 155/132 135/76  Pulse: (!) 135 (!) 131 (!) 125 (!) 117  Resp: (!) 25 (!) 26 (!) 30 15  Temp:      TempSrc:      SpO2: 100% 99% 98% 95%  Weight:      Height:          WBC     Component Value Date/Time   WBC 9.3 02/06/2021 1638   LYMPHSABS 2.4 02/06/2021 1638   MONOABS 0.6 02/06/2021 1638   EOSABS 0.8 (H) 02/06/2021 1638   BASOSABS 0.1 02/06/2021 1638     UA  not ordered     Results for orders placed or performed during the hospital encounter of 02/06/21  Resp Panel by RT-PCR (Flu A&B, Covid) Nasopharyngeal Swab     Status: None    Collection Time: 02/06/21  4:38 PM   Specimen: Nasopharyngeal Swab; Nasopharyngeal(NP) swabs in vial transport medium  Result Value Ref Range Status   SARS Coronavirus 2 by RT PCR NEGATIVE NEGATIVE Final         Influenza A by PCR NEGATIVE NEGATIVE Final   Influenza B by PCR NEGATIVE NEGATIVE Final         ______________________ Hospitalist was called for admission for asthma exacerbation  The following Work up has been ordered so far:  Orders Placed This Encounter  Procedures  . Resp Panel by RT-PCR (Flu A&B, Covid) Nasopharyngeal Swab  . DG Chest Port 1 View  . Comprehensive metabolic panel  . CBC with Differential  . Brain natriuretic peptide  . Cardiac monitoring  . Initiate Carrier Fluid Protocol  . Cardiac monitoring  . Consult to hospitalist  . Pulse oximetry, continuous  . Pulse oximetry, continuous  . ED EKG  . Saline lock IV  . Insert peripheral IV    Following Medications were ordered in ER: Medications  potassium chloride SA (KLOR-CON) CR tablet 40 mEq (has no administration in time range)  albuterol (VENTOLIN HFA) 108 (90 Base) MCG/ACT inhaler 4 puff (4 puffs Inhalation Given 02/06/21 1617)  aerochamber Z-Stat Plus/medium 1 each (1 each Other Given 02/06/21 1618)  ipratropium (ATROVENT HFA) inhaler 2 puff (2 puffs Inhalation Given 02/06/21 1623)  potassium chloride SA (KLOR-CON) CR tablet 40 mEq (40 mEq Oral Given 02/06/21 1813)  magnesium sulfate IVPB 2 g 50 mL (0 g Intravenous Stopped 02/06/21 2134)  methylPREDNISolone sodium succinate (SOLU-MEDROL) 125 mg/2 mL injection 125 mg (125 mg Intravenous Given 02/06/21 2014)  albuterol (PROVENTIL,VENTOLIN) solution continuous neb (10 mg/hr Nebulization Given 02/06/21 1954)        Consult Orders  (From admission, onward)         Start     Ordered   02/06/21 2200  Consult to hospitalist  Once       Provider:  (Not yet assigned)  Question Answer Comment  Place call to: [facility]  Reason for Consult Admit       02/06/21 2159            OTHER Significant initial  Findings:  labs showing:    Recent Labs  Lab 02/06/21 1638  NA 140  K 2.8*  CO2 24  GLUCOSE 83  BUN 9  CREATININE 0.59  CALCIUM 9.5    Cr  stable,    Lab Results  Component Value Date   CREATININE 0.59 02/06/2021   CREATININE 0.87 01/19/2021   CREATININE 0.78 09/20/2019    Recent Labs  Lab 02/06/21 1638  AST 12*  ALT 12  ALKPHOS 61  BILITOT 1.0  PROT 8.1  ALBUMIN 4.4   Lab Results  Component Value Date   CALCIUM 9.5 02/06/2021      Plt: Lab Results  Component Value Date   PLT 394 02/06/2021      COVID-19 Labs  No results for input(s): DDIMER, FERRITIN, LDH, CRP in the last 72 hours.  Lab Results  Component Value Date   SARSCOV2NAA NEGATIVE 02/06/2021   SARSCOV2NAA NEGATIVE 01/19/2021   [place] Not Detected 05/17/2020   [place] Not Detected 05/03/2020    Venous  Blood Gas result:  pH 7.340  pCO 26.8    ABG    Component Value Date/Time   HCO3 14.1 (L) 02/07/2021 0133   ACIDBASEDEF 9.9 (H) 02/07/2021 0133   O2SAT 94.9 02/07/2021 0133        Recent Labs  Lab 02/06/21 1638  WBC 9.3  NEUTROABS 5.4  HGB 11.2*  HCT 35.9*  MCV 67.9*  PLT 394    HG/HCT  stable,      Component Value Date/Time   HGB 11.2 (L) 02/06/2021 1638   HCT 35.9 (L) 02/06/2021 1638   MCV 67.9 (L) 02/06/2021 1638     BNP (last 3 results) Recent Labs    02/06/21 1638  BNP 23.5       Cultures: No results found for: SDES, SPECREQUEST, CULT, REPTSTATUS   Radiological Exams on Admission: DG Chest Port 1 View  Result Date: 02/06/2021 CLINICAL DATA:  Shortness of breath. EXAM: PORTABLE CHEST 1 VIEW COMPARISON:  Jan 19, 2021. FINDINGS: The heart size and mediastinal contours are within normal limits. Both lungs are clear. The visualized skeletal structures are unremarkable. IMPRESSION: No active disease. Electronically Signed   By: Marijo Conception M.D.   On: 02/06/2021 16:43    _______________________________________________________________________________________________________ Latest  Blood pressure 135/76, pulse (!) 117, temperature 98.2 F (36.8 C), temperature source Oral, resp. rate 15, height 5\' 4"  (1.626 m), weight 66.2 kg, last menstrual period 05/18/1993, SpO2 95 %.   Review of Systems:    Pertinent positives include:   shortness of breath at rest dyspnea on exertion  wheezing. Constitutional:  No weight loss, night sweats, Fevers, chills, fatigue, weight loss  HEENT:  No headaches, Difficulty swallowing,Tooth/dental problems,Sore throat,  No sneezing, itching, ear ache, nasal congestion, post nasal drip,  Cardio-vascular:  No chest pain, Orthopnea, PND, anasarca, dizziness, palpitations.no Bilateral lower extremity swelling  GI:  No heartburn, indigestion, abdominal pain, nausea, vomiting, diarrhea, change in bowel habits, loss of appetite, melena, blood in stool, hematemesis Resp:  no , No excess mucus, no productive cough, No non-productive cough, No coughing up of blood.No change in color of mucus.No  Skin:  no rash or lesions. No jaundice GU:  no dysuria, change in color of urine, no urgency or frequency. No straining to urinate.  No flank pain.  Musculoskeletal:  No  joint pain or no joint swelling. No decreased range of motion. No back pain.  Psych:  No change in mood or affect. No depression or anxiety. No memory loss.  Neuro: no localizing neurological complaints, no tingling, no weakness, no double vision, no gait abnormality, no slurred speech, no confusion  All systems reviewed and apart from [place] all are negative _______________________________________________________________________________________________ Past Medical History:   Past Medical History:  Diagnosis Date  . Asthma      Past Surgical History:  Procedure Laterality Date  . ABDOMINAL HYSTERECTOMY    . FOOT SURGERY Left     Social History:  Ambulatory    independently      reports that she quit smoking about 24 years ago. Her smoking use included cigarettes. She has a 5.00 pack-year smoking history. She has never used smokeless tobacco. She reports current alcohol use. She reports that she does not use drugs.     Family History:   Family History  Problem Relation Age of Onset  . Breast cancer Cousin   . Hypertension Mother   . Diabetes Mother   . Hypertension Father   . Heart failure Brother   . Heart disease Brother    ______________________________________________________________________________________________ Allergies: Allergies  Allergen Reactions  . Other Hives and Anaphylaxis  . Peanut-Containing Drug Products Shortness Of Breath  . Iohexol      Code: HIVES, Desc: patient claims hive from iv contrast. she drank omnipaque laced water oral contrast before informing anyone of an allergy (no problem from the oral at this time). no record of any enhancable procedures done in our system., Onset Date: [id number]   . Latex Swelling  . Methocarbamol Swelling  . Shellfish Allergy Other (See Comments)    Mouth and Throat Swelling.  . Triamcinolone Other (See Comments)  . Hydrochlorothiazide Rash     Prior to Admission medications   Medication Sig Start Date End Date Taking? Authorizing Provider  albuterol (PROVENTIL HFA;VENTOLIN HFA) 108 (90 BASE) MCG/ACT inhaler Inhale 2 puffs into the lungs every 6 (six) hours as needed for wheezing or shortness of breath (wheezing).    [provider]  albuterol (PROVENTIL) (2.5 MG/3ML) 0.083% nebulizer solution Take 2.5 mg by nebulization every 6 (six) hours as needed for wheezing or shortness of breath (wheezing).    [provider]  amLODipine (NORVASC) 5 MG tablet Take 5 mg by mouth daily.    [provider]  budesonide (PULMICORT) 180 MCG/ACT inhaler Inhale 2 puffs into the lungs daily. 01/19/21   [name], MD  clobetasol cream (TEMOVATE) 7.12 % Apply 1  application topically as needed.    [provider]  cyclobenzaprine (FLEXERIL) 10 MG tablet Take 10 mg by mouth as needed. 06/12/20   [provider]  EPINEPHrine 0.3 mg/0.3 mL IJ SOAJ injection epinephrine 0.3 mg/0.3 mL injection, auto-injector    [provider]  Hyoscyamine Sulfate SL 0.125 MG SUBL Place 1 tablet under the tongue as needed. 11/28/20   [provider]  mupirocin ointment (BACTROBAN) 2 % Apply 1 application topically as needed.    [provider]  pantoprazole (PROTONIX) 40 MG tablet 1 tablet 11/28/20   [provider]  potassium chloride 20 MEQ TBCR Take 20 mEq by mouth daily. 01/19/21   [name], MD  zolpidem (AMBIEN CR) 12.5 MG CR tablet Take 6.25 mg by mouth at bedtime as needed for sleep (sleep).    [provider]    ___________________________________________________________________________________________________ Physical Exam: Vitals with BMI 02/06/2021  02/06/2021 02/06/2021  Height - - -  Weight - - -  BMI - - -  Systolic 962 229 798  Diastolic 76 921 194  Pulse 117 125 131     1. General:  in No  Acute distress   Chronically ill  -appearing 2. Psychological: Alert and  Oriented 3. Head/ENT:   Dry Mucous Membranes                          Head Non traumatic, neck supple                         Poor Dentition 4. SKIN:  decreased Skin turgor,  Skin clean Dry and intact no rash 5. Heart: Regular rate and rhythm no Murmur, no Rub or gallop 6. Lungs:  some wheezes or crackles   7. Abdomen: Soft,  non-tender, Non distended  bowel sounds present 8. Lower extremities: no clubbing, cyanosis, no edema 9. Neurologically Grossly intact, moving all 4 extremities equally  10. MSK: Normal range of motion    Chart has been reviewed  ______________________________________________________________________________________________  Assessment/Plan   65 y.o. female with medical history significant of  asthma, anemia    Admitted for asthma exacerbation  Present on Admission: . Asthma exacerbation -   Will initiate: Steroid taper - Albuterol PRN,  -  Breo or Dulera at discharge   -  Mucinex.  Titrate O2 to saturation >90%. Follow patients respiratory status.  VBG - no hypercarbia  Currently mentating well no evidence of symptomatic hypercarbia Would benefit from follow up with pulmonology   . Anemia - order anemia panel No bleeding problems  Hypokalemia - - will replace and repeat in AM,  check magnesium level and replace as needed   Other plan as per orders.  DVT prophylaxis:   Lovenox       Code Status:    Code Status: Not on file FULL CODE   as per patient  I had personally discussed CODE STATUS with patient     Family Communication:   Family not at  Bedside    Disposition Plan:     To home once workup is complete and patient is stable   Following barriers for discharge:                            Electrolytes corrected                               Anemia stable                                                 Consults called: please consult Pulmonology in AM  Admission status:  ED Disposition    ED Disposition Condition [place]: [place] [[id number]]  Level of Care: Progressive [102]  Admit to Progressive based on following criteria: RESPIRATORY PROBLEMS hypoxemic/hypercapnic respiratory failure that is responsive to NIPPV (BiPAP) or High Flow Nasal Cannula (6-80 lpm). Frequent assessment/intervention, no > Q2 hrs < Q4 hrs, to maintain oxygenation and pulmonary hygiene.  May admit patient to [facility] or [facility] if equivalent level of care is available:: No  Covid  Evaluation: Confirmed COVID Negative  Diagnosis: Asthma exacerbation [[id number]]  Admitting Physician: Toy Baker [[id number]]  Attending Physician: Toy Baker [[id number]]  Estimated length of stay: past midnight tomorrow  Certification:: I certify  this patient will need inpatient services for at least 2 midnights          inpatient     I Expect 2 midnight stay secondary to severity of patient's current illness need for inpatient interventions justified by the following:  hemodynamic instability despite optimal treatment (tachycardia )   and extensive comorbidities including:   COPD/asthma .     That are currently affecting medical management.   I expect  patient to be hospitalized for 2 midnights requiring inpatient medical care.  Patient is at high risk for adverse outcome (such as loss of life or disability) if not treated.  Indication for inpatient stay as follows:  New or worsening hypoxia  Need for  IV fluids, IV steroid    Level of care   Progressive  tele indefinitely please discontinue once patient no longer qualifies COVID-19 Labs    Lab Results  Component Value Date   [place] NEGATIVE 02/06/2021     Precautions: admitted as Covid Negative      PPE: Used by the provider:   N95   eye Goggles,  Gloves    Nason Conradt 02/07/2021, 2:01 AM    [facility]     after 2 AM please page floor coverage PA If 7AM-7PM, please contact the day team taking care of the patient using [email or web address]   Patient was evaluated in the context of the global COVID-19 pandemic, which necessitated consideration that the patient might be at risk for infection with the SARS-CoV-2 virus that causes COVID-19. Institutional protocols and algorithms that pertain to the evaluation of patients at risk for COVID-19 are in a state of rapid change based on information released by regulatory bodies including the CDC and federal and state organizations. These policies and algorithms were followed during the patient's care.

## 2021-02-06 NOTE — ED Triage Notes (Signed)
She reports shortness of breath and coughing x 6 weeks. She reports relief with steroids and breathing treatments but symptoms return after stopping.

## 2021-02-06 NOTE — ED Notes (Signed)
Breathing treatment paused. Patient stated she needed to use the bathroom.

## 2021-02-07 DIAGNOSIS — D649 Anemia, unspecified: Secondary | ICD-10-CM | POA: Diagnosis present

## 2021-02-07 DIAGNOSIS — J4541 Moderate persistent asthma with (acute) exacerbation: Secondary | ICD-10-CM | POA: Diagnosis not present

## 2021-02-07 DIAGNOSIS — E538 Deficiency of other specified B group vitamins: Secondary | ICD-10-CM

## 2021-02-07 LAB — BLOOD GAS, VENOUS
Acid-base deficit: 9.9 mmol/L — ABNORMAL HIGH (ref 0.0–2.0)
Bicarbonate: 14.1 mmol/L — ABNORMAL LOW (ref 20.0–28.0)
O2 Saturation: 94.9 %
Patient temperature: 98.6
pCO2, Ven: 26.8 mmHg — ABNORMAL LOW (ref 44.0–60.0)
pH, Ven: 7.34 (ref 7.250–7.430)
pO2, Ven: 86.2 mmHg — ABNORMAL HIGH (ref 32.0–45.0)

## 2021-02-07 LAB — COMPREHENSIVE METABOLIC PANEL
ALT: 13 U/L (ref 0–44)
AST: 17 U/L (ref 15–41)
Albumin: 4.2 g/dL (ref 3.5–5.0)
Alkaline Phosphatase: 59 U/L (ref 38–126)
Anion gap: 10 (ref 5–15)
BUN: 11 mg/dL (ref 8–23)
CO2: 16 mmol/L — ABNORMAL LOW (ref 22–32)
Calcium: 9.5 mg/dL (ref 8.9–10.3)
Chloride: 112 mmol/L — ABNORMAL HIGH (ref 98–111)
Creatinine, Ser: 0.68 mg/dL (ref 0.44–1.00)
GFR, Estimated: >60 mL/min (ref >60)
Glucose, Bld: 212 mg/dL — ABNORMAL HIGH (ref 70–99)
Potassium: 3.8 mmol/L (ref 3.5–5.1)
Sodium: 138 mmol/L (ref 135–145)
Total Bilirubin: 0.5 mg/dL (ref 0.3–1.2)
Total Protein: 7.9 g/dL (ref 6.5–8.1)

## 2021-02-07 LAB — CBC WITH DIFFERENTIAL/PLATELET
Abs Immature Granulocytes: 0.06 10*3/uL (ref 0.00–0.07)
Basophils Absolute: 0 10*3/uL (ref 0.0–0.1)
Basophils Relative: 0 %
Eosinophils Absolute: 0 10*3/uL (ref 0.0–0.5)
Eosinophils Relative: 0 %
HCT: 33.9 % — ABNORMAL LOW (ref 36.0–46.0)
Hemoglobin: 10.6 g/dL — ABNORMAL LOW (ref 12.0–15.0)
Immature Granulocytes: 1 %
Lymphocytes Relative: 6 %
Lymphs Abs: 0.6 10*3/uL — ABNORMAL LOW (ref 0.7–4.0)
MCH: 21.2 pg — ABNORMAL LOW (ref 26.0–34.0)
MCHC: 31.3 g/dL (ref 30.0–36.0)
MCV: 67.9 fL — ABNORMAL LOW (ref 80.0–100.0)
Monocytes Absolute: 0.1 10*3/uL (ref 0.1–1.0)
Monocytes Relative: 1 %
Neutro Abs: 10.2 10*3/uL — ABNORMAL HIGH (ref 1.7–7.7)
Neutrophils Relative %: 92 %
Platelets: 375 10*3/uL (ref 150–400)
RBC: 4.99 MIL/uL (ref 3.87–5.11)
RDW: 19.5 % — ABNORMAL HIGH (ref 11.5–15.5)
WBC: 10.9 10*3/uL — ABNORMAL HIGH (ref 4.0–10.5)
nRBC: 0 % (ref 0.0–0.2)

## 2021-02-07 LAB — IRON AND TIBC
Iron: 21 ug/dL — ABNORMAL LOW (ref 28–170)
Saturation Ratios: 5 % — ABNORMAL LOW (ref 10.4–31.8)
TIBC: 416 ug/dL (ref 250–450)
UIBC: 395 ug/dL

## 2021-02-07 LAB — FERRITIN: Ferritin: 31 ng/mL (ref 11–307)

## 2021-02-07 LAB — HIV ANTIBODY (ROUTINE TESTING W REFLEX): HIV Screen 4th Generation wRfx: NONREACTIVE

## 2021-02-07 LAB — D-DIMER, QUANTITATIVE: D-Dimer, Quant: 1.06 ug/mL-FEU — ABNORMAL HIGH (ref 0.00–0.50)

## 2021-02-07 LAB — MAGNESIUM: Magnesium: 2.1 mg/dL (ref 1.7–2.4)

## 2021-02-07 LAB — PHOSPHORUS: Phosphorus: 2 mg/dL — ABNORMAL LOW (ref 2.5–4.6)

## 2021-02-07 LAB — RETICULOCYTES
Immature Retic Fract: 22 % — ABNORMAL HIGH (ref 2.3–15.9)
RBC.: 4.94 MIL/uL (ref 3.87–5.11)
Retic Count, Absolute: 56.8 10*3/uL (ref 19.0–186.0)
Retic Ct Pct: 1.2 % (ref 0.4–3.1)

## 2021-02-07 LAB — TSH: TSH: 0.486 u[IU]/mL (ref 0.350–4.500)

## 2021-02-07 LAB — POTASSIUM: Potassium: 3.8 mmol/L (ref 3.5–5.1)

## 2021-02-07 LAB — MRSA PCR SCREENING: MRSA by PCR: NEGATIVE

## 2021-02-07 LAB — FOLATE: Folate: 22 ng/mL (ref >5.9)

## 2021-02-07 LAB — VITAMIN B12: Vitamin B-12: 147 pg/mL — ABNORMAL LOW (ref 180–914)

## 2021-02-07 MED ORDER — METHYLPREDNISOLONE SODIUM SUCC 125 MG IJ SOLR
60.0000 mg | Freq: Four times a day (QID) | INTRAMUSCULAR | Status: DC
  Administered 2021-02-07: 60 mg via INTRAVENOUS
  Filled 2021-02-07: qty 2

## 2021-02-07 MED ORDER — BUDESONIDE 180 MCG/ACT IN AEPB: 2.0000 | INHALATION_SPRAY | Freq: Every day | RESPIRATORY_TRACT | Status: DC

## 2021-02-07 MED ORDER — PREDNISONE 20 MG PO TABS: 20.0000 mg | ORAL_TABLET | Freq: Every day | ORAL | Status: DC

## 2021-02-07 MED ORDER — PREDNISONE 20 MG PO TABS
40.0000 mg | ORAL_TABLET | Freq: Every day | ORAL | Status: DC
  Administered 2021-02-08: 40 mg via ORAL
  Filled 2021-02-07: qty 2

## 2021-02-07 MED ORDER — METHYLPREDNISOLONE SODIUM SUCC 125 MG IJ SOLR: 60.0000 mg | Freq: Four times a day (QID) | INTRAMUSCULAR | Status: DC

## 2021-02-07 MED ORDER — METHYLPREDNISOLONE SODIUM SUCC 40 MG IJ SOLR
40.0000 mg | Freq: Two times a day (BID) | INTRAMUSCULAR | Status: AC
  Administered 2021-02-07: 40 mg via INTRAVENOUS
  Filled 2021-02-07 (×3): qty 1

## 2021-02-07 MED ORDER — CYANOCOBALAMIN 1000 MCG/ML IJ SOLN
1000.0000 ug | Freq: Every day | INTRAMUSCULAR | Status: DC
  Administered 2021-02-07 – 2021-02-08 (×2): 1000 ug via INTRAMUSCULAR
  Filled 2021-02-07 (×2): qty 1

## 2021-02-07 MED ORDER — BUDESONIDE 0.25 MG/2ML IN SUSP: 0.2500 mg | Freq: Every day | RESPIRATORY_TRACT | Status: DC

## 2021-02-07 MED ORDER — HYDROCODONE-ACETAMINOPHEN 5-325 MG PO TABS
1.0000 | ORAL_TABLET | ORAL | Status: DC | PRN
  Administered 2021-02-07: 1 via ORAL
  Filled 2021-02-07: qty 1
  Filled 2021-02-07: qty 2

## 2021-02-07 MED ORDER — PREDNISONE 10 MG PO TABS: 10.0000 mg | ORAL_TABLET | Freq: Every day | ORAL | Status: DC

## 2021-02-07 MED ORDER — ENOXAPARIN SODIUM 40 MG/0.4ML IJ SOSY
40.0000 mg | PREFILLED_SYRINGE | INTRAMUSCULAR | Status: DC
  Administered 2021-02-07: 40 mg via SUBCUTANEOUS
  Filled 2021-02-07 (×2): qty 0.4

## 2021-02-07 MED ORDER — SODIUM CHLORIDE 0.9 % IV SOLN
75.0000 mL/h | INTRAVENOUS | Status: DC
  Administered 2021-02-07: 75 mL/h via INTRAVENOUS

## 2021-02-07 MED ORDER — IPRATROPIUM-ALBUTEROL 0.5-2.5 (3) MG/3ML IN SOLN
3.0000 mL | Freq: Four times a day (QID) | RESPIRATORY_TRACT | Status: DC
  Administered 2021-02-07 – 2021-02-08 (×6): 3 mL via RESPIRATORY_TRACT
  Filled 2021-02-07 (×6): qty 3

## 2021-02-07 MED ORDER — ALBUTEROL SULFATE (2.5 MG/3ML) 0.083% IN NEBU
2.5000 mg | INHALATION_SOLUTION | RESPIRATORY_TRACT | Status: DC | PRN
  Administered 2021-02-07: 2.5 mg via RESPIRATORY_TRACT
  Filled 2021-02-07: qty 3

## 2021-02-07 MED ORDER — POTASSIUM CHLORIDE CRYS ER 20 MEQ PO TBCR
20.0000 meq | EXTENDED_RELEASE_TABLET | Freq: Every day | ORAL | Status: DC
  Administered 2021-02-07 – 2021-02-08 (×2): 20 meq via ORAL
  Filled 2021-02-07 (×2): qty 1

## 2021-02-07 MED ORDER — PANTOPRAZOLE SODIUM 40 MG PO TBEC
40.0000 mg | DELAYED_RELEASE_TABLET | Freq: Every day | ORAL | Status: DC
  Administered 2021-02-07 – 2021-02-08 (×2): 40 mg via ORAL
  Filled 2021-02-07 (×2): qty 1

## 2021-02-07 MED ORDER — POTASSIUM CHLORIDE 10 MEQ/100ML IV SOLN
INTRAVENOUS | Status: AC
  Administered 2021-02-07: 10 meq via INTRAVENOUS
  Filled 2021-02-07: qty 100

## 2021-02-07 MED ORDER — ACETAMINOPHEN 650 MG RE SUPP: 650.0000 mg | Freq: Four times a day (QID) | RECTAL | Status: DC | PRN

## 2021-02-07 MED ORDER — PREDNISONE 20 MG PO TABS: 30.0000 mg | ORAL_TABLET | Freq: Every day | ORAL | Status: DC

## 2021-02-07 MED ORDER — AMLODIPINE BESYLATE 5 MG PO TABS
5.0000 mg | ORAL_TABLET | Freq: Every day | ORAL | Status: DC
  Administered 2021-02-07 – 2021-02-08 (×2): 5 mg via ORAL
  Filled 2021-02-07 (×2): qty 1

## 2021-02-07 MED ORDER — PREDNISONE 20 MG PO TABS: 40.0000 mg | ORAL_TABLET | Freq: Every day | ORAL | Status: DC

## 2021-02-07 MED ORDER — BUDESONIDE 0.5 MG/2ML IN SUSP
0.5000 mg | Freq: Two times a day (BID) | RESPIRATORY_TRACT | Status: DC
  Administered 2021-02-07 – 2021-02-08 (×3): 0.5 mg via RESPIRATORY_TRACT
  Filled 2021-02-07 (×3): qty 2

## 2021-02-07 MED ORDER — ACETAMINOPHEN 325 MG PO TABS: 650.0000 mg | ORAL_TABLET | Freq: Four times a day (QID) | ORAL | Status: DC | PRN

## 2021-02-07 MED ORDER — ZOLPIDEM TARTRATE 10 MG PO TABS
5.0000 mg | ORAL_TABLET | Freq: Every evening | ORAL | Status: DC | PRN
  Administered 2021-02-07: 5 mg via ORAL
  Filled 2021-02-07: qty 1

## 2021-02-07 NOTE — Consult Note (Addendum)
NAME:  Sandra Carr, MRN:  [id number], DOB:  22-Feb-1956, LOS: 1 ADMISSION DATE:  02/06/2021, CONSULTATION DATE: 6/1 REFERRING MD: Dr. Starla Link, CHIEF COMPLAINT: Asthma exacerbation  History of Present Illness:  65 year old female with past medical history as below, which is significant for asthma.  She was diagnosed as a child and received allergy shots for years but stopped several years ago.  She has been controlled on Advair with as needed albuterol when needed to be used every once in a great while.  Recent course has been concerning for belching and chest discomfort for which she has undergone GI and cardiac work-up without clear etiology at this time.  Throughout those several doctors visits her Advair was discontinued at one point.  Shortly after Advair was discontinued she was exposed to Lysol inhalation while cleaning, which triggered dyspnea the following day.  She presented to PCP and was treated with prednisone taper, which worked initially, but symptoms returned when prednisone stopped. She presented to urgent care with a similar treatment and result.   On 5/31 her symptoms again worsened and she was unable to catch her breath despite albuterol nebs every 6 hours. Peak flow 150 at home. This caused her to present to [facility]. Upon arrival to the ED she was quite dyspneic and wheezing, raising concern for asthma exacerbation. Despite multiple duonebs and steroid dosing she continued to wheeze and experience distress. She was admitted to the hospitalists and treated with scheduled duonebs and budesonide, as well as IV steroids. PCCM consulted 6/1 for further recs.   Pertinent  Medical History   has a past medical history of Asthma.   Significant Hospital Events: Including procedures, antibiotic start and stop dates in addition to other pertinent events   .  5/31 admit for asthma exacerbation  Interim History / Subjective:    Objective   Blood pressure 135/62, pulse (!) 103,  temperature 97.9 F (36.6 C), temperature source Oral, resp. rate (!) 23, height 5\' 4"  (1.626 m), weight 66.2 kg, last menstrual period 05/18/1993, SpO2 97 %.        Intake/Output Summary (Last 24 hours) at 02/07/2021 1035 Last data filed at 02/07/2021 0751 Gross per 24 hour  Intake 986.95 ml  Output --  Net 986.95 ml   Filed Weights   02/06/21 1313  Weight: 66.2 kg    Examination: General: middle aged female in no distress HENT: [place]/AT, PERRL, no JVD Lungs: Expiratory wheeze, no distress, sats 97% on room air. Speaking full sentences.  Cardiovascular: Tachy, regular, no MRG Abdomen: Soft, non-tender, non-distended Extremities: No acute deformity. No edema.  Neuro: Alert, oriented, non-focal.   Labs/imaging that I have personally reviewed  (right click and "Reselect all SmartList Selections" daily)  CXR: unremarkable peripheral eosinophils 9 on admission WBC 9.3 pre steroids.  Glucose 212 post steroids Phosphorus 2  Resolved Hospital Problem list     Assessment & Plan:   Acute exacerbation on asthma: likely moderate persistent asthma based on daily symptoms somewhat interfering with ADLs. She was treated with advair for years, but recently stopped by GI just prior to her Lysol exposure.  - Restart advair  - PRN albuterol - Transition steroids to prednisone with plans for 12 day taper. 40mg  x 3, 30mg  x 3, 20mg  x 3, 10mg  x3 then stop starting tomorrow afte one more dose of solumedrol tonight.  - Will get her in for pulmonary follow up towards the end of the taper. 6/10 with Rexene Edison NP - Monitor glucose  due to high dose steroids.    Best practice (right click and "Reselect all SmartList Selections" daily)  Diet:  Oral Pain/Anxiety/Delirium protocol (if indicated): No VAP protocol (if indicated): Not indicated DVT prophylaxis:per primary GI prophylaxis: N/A Glucose control:  SSI No Central venous access:  N/A Arterial line:  N/A Foley:  N/A Mobility:  OOB  PT  consulted: N/A Last date of multidisciplinary goals of care discussion []  Code Status:  full code Disposition: admit  Labs   CBC: Recent Labs  Lab 02/06/21 1638 02/07/21 0452  WBC 9.3 10.9*  NEUTROABS 5.4 10.2*  HGB 11.2* 10.6*  HCT 35.9* 33.9*  MCV 67.9* 67.9*  PLT 394 941    Basic Metabolic Panel: Recent Labs  Lab 02/06/21 1638 02/06/21 1816 02/07/21 0345 02/07/21 0452  NA 140  --   --  138  K 2.8*  --  3.8 3.8  CL 107  --   --  112*  CO2 24  --   --  16*  GLUCOSE 83  --   --  212*  BUN 9  --   --  11  CREATININE 0.59  --   --  0.68  CALCIUM 9.5  --   --  9.5  MG  --  2.2  --  2.1  PHOS  --   --   --  2.0*   GFR: Estimated Creatinine Clearance: 64.8 mL/min (by C-G formula based on SCr of 0.68 mg/dL). Recent Labs  Lab 02/06/21 1638 02/07/21 0452  WBC 9.3 10.9*    Liver Function Tests: Recent Labs  Lab 02/06/21 1638 02/07/21 0452  AST 12* 17  ALT 12 13  ALKPHOS 61 59  BILITOT 1.0 0.5  PROT 8.1 7.9  ALBUMIN 4.4 4.2   No results for input(s): LIPASE, AMYLASE in the last 168 hours. No results for input(s): AMMONIA in the last 168 hours.  ABG    Component Value Date/Time   HCO3 14.1 (L) 02/07/2021 0133   ACIDBASEDEF 9.9 (H) 02/07/2021 0133   O2SAT 94.9 02/07/2021 0133     Coagulation Profile: No results for input(s): INR, PROTIME in the last 168 hours.  Cardiac Enzymes: No results for input(s): CKTOTAL, CKMB, CKMBINDEX, TROPONINI in the last 168 hours.  HbA1C: No results found for: HGBA1C  CBG: No results for input(s): GLUCAP in the last 168 hours.  Review of Systems:   Bolds are positive  Constitutional: weight loss, gain, night sweats, Fevers, chills, fatigue .  HEENT: headaches, Sore throat, sneezing, nasal congestion, post nasal drip, Difficulty swallowing, Tooth/dental problems, visual complaints visual changes, ear ache CV:  chest pain, radiates:,Orthopnea, PND, swelling in lower extremities, dizziness, palpitations, syncope.  GI   heartburn, indigestion, abdominal pain, nausea, vomiting, diarrhea, change in bowel habits, loss of appetite, bloody stools.  Resp: cough, productive: , hemoptysis, dyspnea, chest pain, pleuritic.  Skin: rash or itching or icterus GU: dysuria, change in color of urine, urgency or frequency. flank pain, hematuria  MS: joint pain or swelling. decreased range of motion  Psych: change in mood or affect. depression or anxiety.  Neuro: difficulty with speech, weakness, numbness, ataxia    Past Medical History:  She,  has a past medical history of Asthma.   Surgical History:   Past Surgical History:  Procedure Laterality Date  . ABDOMINAL HYSTERECTOMY    . FOOT SURGERY Left      Social History:   reports that she quit smoking about 24 years ago. Her smoking use included cigarettes.  She has a 5.00 pack-year smoking history. She has never used smokeless tobacco. She reports current alcohol use. She reports that she does not use drugs.   Family History:  Her family history includes Breast cancer in her cousin; Diabetes in her mother; Heart disease in her brother; Heart failure in her brother; Hypertension in her father and mother.   Allergies Allergies  Allergen Reactions  . Other Hives and Anaphylaxis  . Peanut-Containing Drug Products Shortness Of Breath  . Iohexol      Code: HIVES, Desc: patient claims hive from iv contrast. she drank omnipaque laced water oral contrast before informing anyone of an allergy (no problem from the oral at this time). no record of any enhancable procedures done in our system., Onset Date: [id number]   . Latex Swelling  . Methocarbamol Swelling  . Shellfish Allergy Other (See Comments)    Mouth and Throat Swelling.  . Triamcinolone Other (See Comments)  . Hydrochlorothiazide Rash     Home Medications  Prior to Admission medications   Medication Sig Start Date End Date Taking? Authorizing Provider  albuterol (PROVENTIL HFA;VENTOLIN HFA) 108 (90 BASE)  MCG/ACT inhaler Inhale 2 puffs into the lungs every 6 (six) hours as needed for wheezing or shortness of breath (wheezing).    [provider]  albuterol (PROVENTIL) (2.5 MG/3ML) 0.083% nebulizer solution Take 2.5 mg by nebulization every 6 (six) hours as needed for wheezing or shortness of breath (wheezing).    [provider]  amLODipine (NORVASC) 5 MG tablet Take 5 mg by mouth daily.    [provider]  budesonide (PULMICORT) 180 MCG/ACT inhaler Inhale 2 puffs into the lungs daily. 01/19/21   [name], MD  clobetasol cream (TEMOVATE) 3.42 % Apply 1 application topically as needed.    [provider]  cyclobenzaprine (FLEXERIL) 10 MG tablet Take 10 mg by mouth as needed. 06/12/20   [provider]  EPINEPHrine 0.3 mg/0.3 mL IJ SOAJ injection epinephrine 0.3 mg/0.3 mL injection, auto-injector    [provider]  Hyoscyamine Sulfate SL 0.125 MG SUBL Place 1 tablet under the tongue as needed. 11/28/20   [provider]  mupirocin ointment (BACTROBAN) 2 % Apply 1 application topically as needed.    [provider]  pantoprazole (PROTONIX) 40 MG tablet 1 tablet 11/28/20   [provider]  potassium chloride 20 MEQ TBCR Take 20 mEq by mouth daily. 01/19/21   [name], MD  zolpidem (AMBIEN CR) 12.5 MG CR tablet Take 6.25 mg by mouth at bedtime as needed for sleep (sleep).    [provider]  zolpidem (AMBIEN) 10 MG tablet Take 10 mg by mouth at bedtime as needed for sleep. 01/31/21   [provider]     Critical care time:      Georgann Housekeeper, AGACNP-BC [facility] for personal pager PCCM on call pager [phone] until 7pm. Please call Elink 7p-7a. [phone]  02/07/2021 11:39 AM

## 2021-02-07 NOTE — ED Notes (Signed)
O2 remained above 95 while ambulating

## 2021-02-07 NOTE — Plan of Care (Signed)
  Problem: Education: Goal: Knowledge of General Education information will improve Description: Including pain rating scale, medication(s)/side effects and non-pharmacologic comfort measures Outcome: Progressing   Problem: Clinical Measurements: Goal: Ability to maintain clinical measurements within normal limits will improve Outcome: Progressing   Problem: Nutrition: Goal: Adequate nutrition will be maintained Outcome: Progressing   Problem: Elimination: Goal: Will not experience complications related to urinary retention Outcome: Progressing

## 2021-02-07 NOTE — Progress Notes (Signed)
Patient ID: Sandra Carr, female   DOB: 18-May-1956, 65 y.o.   MRN: [id number]  PROGRESS NOTE    Sandra Carr  OZH:[id number] DOB: Dec 21, 1955 DOA: 02/06/2021 PCP: Vernie Shanks, MD   Brief Narrative:  65 year old female with history of asthma, anemia presented with worsening shortness of breath progressively getting worse over the last 6 weeks.  She was treated with steroids twice as an outpatient by her PCP.  She reports inhaling Lysol more than a month ago.  On presentation, COVID test was negative.  She was unable to speak in full sentences.  Chest x-ray was negative for infiltrates.  She was started on IV Solu-Medrol.  Assessment & Plan:  Probable asthma exacerbation -Patient has had progressively worsening shortness of breath for the last 6 weeks despite oral steroid treatment x2 by her PCP.  She has already been evaluated by cardiology and GI as an outpatient - She reports inhaling Lysol more than a month ago.  On presentation, COVID test was negative.  She was unable to speak in full sentences.  Chest x-ray was negative for infiltrates.  She was started on IV Solu-Medrol. -Still feels weak and does not feel well.  Continue Solu-Medrol 60 mg IV every 6 hours along with nebs.  I have requested pulmonary evaluation.  Follow recommendations. -Currently on room air.  Chronic microcytic anemia -Hemoglobin stable.  Iron level 21.  Outpatient follow-up with GI  Hypertension -Continue amlodipine  Vitamin B12 deficiency -B12 level 147.  Supplement parenterally while inpatient and switch to oral supplementation upon discharge  Hypokalemia -Resolved  DVT prophylaxis: Lovenox Code Status: Full Family Communication: Daughter at bedside Disposition Plan: Status is: Inpatient  Remains inpatient appropriate because:Inpatient level of care appropriate due to severity of illness   Dispo: The patient is from: Home              Anticipated d/c is to: Home              Patient currently  is not medically stable to d/c.   Difficult to place patient No  Consultants: Pulmonary  Procedures: None  Antimicrobials: None   Subjective: Patient seen and examined at bedside.  Feels slightly better but still feels very weak with intermittent cough and shortness of breath.  No overnight fever or vomiting reported.  Objective: Vitals:   02/07/21 0716 02/07/21 0800 02/07/21 1000 02/07/21 1035  BP: 104/70 (!) 151/85 135/62   Pulse: (!) 103 100 (!) 103 100  Resp: 19 14 (!) 23 17  Temp: 97.9 F (36.6 C)     TempSrc: Oral     SpO2: 97% 98% 97% 97%  Weight:      Height:        Intake/Output Summary (Last 24 hours) at 02/07/2021 1115 Last data filed at 02/07/2021 0751 Gross per 24 hour  Intake 986.95 ml  Output --  Net 986.95 ml   Filed Weights   02/06/21 1313  Weight: 66.2 kg    Examination:  General exam: Appears calm and comfortable.  Currently on room air. Respiratory system: Bilateral decreased breath sounds at bases with some scattered crackles and intermittent tachypnea Cardiovascular system: S1 & S2 heard, intermittently tachycardic Gastrointestinal system: Abdomen is nondistended, soft and nontender. Normal bowel sounds heard. Extremities: No cyanosis, clubbing, edema  Central nervous system: Alert and oriented. No focal neurological deficits. Moving extremities Skin: No rashes, lesions or ulcers Psychiatry: Flat affect.  Intermittently appears anxious.   Data Reviewed: I have personally reviewed following labs  and imaging studies  CBC: Recent Labs  Lab 02/06/21 1638 02/07/21 0452  WBC 9.3 10.9*  NEUTROABS 5.4 10.2*  HGB 11.2* 10.6*  HCT 35.9* 33.9*  MCV 67.9* 67.9*  PLT 394 154   Basic Metabolic Panel: Recent Labs  Lab 02/06/21 1638 02/06/21 1816 02/07/21 0345 02/07/21 0452  NA 140  --   --  138  K 2.8*  --  3.8 3.8  CL 107  --   --  112*  CO2 24  --   --  16*  GLUCOSE 83  --   --  212*  BUN 9  --   --  11  CREATININE 0.59  --   --  0.68   CALCIUM 9.5  --   --  9.5  MG  --  2.2  --  2.1  PHOS  --   --   --  2.0*   GFR: Estimated Creatinine Clearance: 64.8 mL/min (by C-G formula based on SCr of 0.68 mg/dL). Liver Function Tests: Recent Labs  Lab 02/06/21 1638 02/07/21 0452  AST 12* 17  ALT 12 13  ALKPHOS 61 59  BILITOT 1.0 0.5  PROT 8.1 7.9  ALBUMIN 4.4 4.2   No results for input(s): LIPASE, AMYLASE in the last 168 hours. No results for input(s): AMMONIA in the last 168 hours. Coagulation Profile: No results for input(s): INR, PROTIME in the last 168 hours. Cardiac Enzymes: No results for input(s): CKTOTAL, CKMB, CKMBINDEX, TROPONINI in the last 168 hours. BNP (last 3 results) No results for input(s): PROBNP in the last 8760 hours. HbA1C: No results for input(s): HGBA1C in the last 72 hours. CBG: No results for input(s): GLUCAP in the last 168 hours. Lipid Profile: No results for input(s): CHOL, HDL, LDLCALC, TRIG, CHOLHDL, LDLDIRECT in the last 72 hours. Thyroid Function Tests: Recent Labs    02/07/21 0452  TSH 0.486   Anemia Panel: Recent Labs    02/07/21 0133 02/07/21 0452  VITAMINB12 147*  --   FOLATE 22.0  --   FERRITIN 31  --   TIBC 416  --   IRON 21*  --   RETICCTPCT  --  1.2   Sepsis Labs: No results for input(s): PROCALCITON, LATICACIDVEN in the last 168 hours.  Recent Results (from the past 240 hour(s))  Resp Panel by RT-PCR (Flu A&B, Covid) Nasopharyngeal Swab     Status: None   Collection Time: 02/06/21  4:38 PM   Specimen: Nasopharyngeal Swab; Nasopharyngeal(NP) swabs in vial transport medium  Result Value Ref Range Status   SARS Coronavirus 2 by RT PCR NEGATIVE NEGATIVE Final    Comment: (NOTE) SARS-CoV-2 target nucleic acids are NOT DETECTED.  The SARS-CoV-2 RNA is generally detectable in upper respiratory specimens during the acute phase of infection. The lowest concentration of SARS-CoV-2 viral copies this assay can detect is 138 copies/mL. A negative result does not  preclude SARS-Cov-2 infection and should not be used as the sole basis for treatment or other patient management decisions. A negative result may occur with  improper specimen collection/handling, submission of specimen other than nasopharyngeal swab, presence of viral mutation(s) within the areas targeted by this assay, and inadequate number of viral copies(<138 copies/mL). A negative result must be combined with clinical observations, patient history, and epidemiological information. The expected result is Negative.  Fact Sheet for Patients:  EntrepreneurPulse.com.au  Fact Sheet for Healthcare Providers:  IncredibleEmployment.be  This test is no t yet approved or cleared by the [place] FDA  and  has been authorized for detection and/or diagnosis of SARS-CoV-2 by FDA under an Emergency Use Authorization (EUA). This EUA will remain  in effect (meaning this test can be used) for the duration of the COVID-19 declaration under Section 564(b)(1) of the Act, 21 U.S.C.section 360bbb-3(b)(1), unless the authorization is terminated  or revoked sooner.       Influenza A by PCR NEGATIVE NEGATIVE Final   Influenza B by PCR NEGATIVE NEGATIVE Final    Comment: (NOTE) The Xpert Xpress SARS-CoV-2/FLU/RSV plus assay is intended as an aid in the diagnosis of influenza from Nasopharyngeal swab specimens and should not be used as a sole basis for treatment. Nasal washings and aspirates are unacceptable for Xpert Xpress SARS-CoV-2/FLU/RSV testing.  Fact Sheet for Patients: EntrepreneurPulse.com.au  Fact Sheet for Healthcare Providers: IncredibleEmployment.be  This test is not yet approved or cleared by the [place] FDA and has been authorized for detection and/or diagnosis of SARS-CoV-2 by FDA under an Emergency Use Authorization (EUA). This EUA will remain in effect (meaning this test can be used) for the  duration of the COVID-19 declaration under Section 564(b)(1) of the Act, 21 U.S.C. section 360bbb-3(b)(1), unless the authorization is terminated or revoked.  Performed at [facility], [place] [address], [place], [place] [address]          Radiology Studies: [facility] Chest Port 1 View  Result Date: 02/06/2021 CLINICAL DATA:  Shortness of breath. EXAM: PORTABLE CHEST 1 VIEW COMPARISON:  Jan 19, 2021. FINDINGS: The heart size and mediastinal contours are within normal limits. Both lungs are clear. The visualized skeletal structures are unremarkable. IMPRESSION: No active disease. Electronically Signed   By: Marijo Conception M.D.   On: 02/06/2021 16:43        Scheduled Meds: . amLODipine  5 mg Oral Daily  . budesonide (PULMICORT) nebulizer solution  0.5 mg Nebulization BID  . enoxaparin (LOVENOX) injection  40 mg Subcutaneous Q24H  . ipratropium-albuterol  3 mL Nebulization Q6H  . methylPREDNISolone (SOLU-MEDROL) injection  60 mg Intravenous Q6H  . pantoprazole  40 mg Oral Daily  . potassium chloride SA  20 mEq Oral Daily   Continuous Infusions:        Aline August, MD [facility] 02/07/2021, 11:15 AM

## 2021-02-07 NOTE — ED Notes (Signed)
Patient had decided to stay and be admitted. Patient ambulated to the bathroom.

## 2021-02-07 NOTE — Evaluation (Signed)
Physical Therapy Evaluation Patient Details Name: Sandra Carr MRN: [id number] DOB: 18-Oct-1955 Today's Date: 02/07/2021   History of Present Illness  65 year old female with history of asthma, anemia presented with worsening shortness of breath progressively getting worse over the last 6 weeks. Admitted for  probable asthma exacerbation.  Clinical Impression  The patient is ambulating  With close supervision x 150'. Frequent coughs. SPO2 100% on RA. Patient  Should progress to Return home. Pt admitted with above diagnosis. Pt currently with functional limitations due to the deficits listed below (see PT Problem List). Pt will benefit from skilled PT to increase their independence and safety with mobility to allow discharge to the venue listed below.       Follow Up Recommendations No PT follow up    Equipment Recommendations  None recommended by PT    Recommendations for Other Services       Precautions / Restrictions        Mobility  Bed Mobility Overal bed mobility: Independent                  Transfers Overall transfer level: Independent Equipment used: None                Ambulation/Gait Ambulation/Gait assistance: Supervision Gait Distance (Feet): 150 Feet Assistive device: None Gait Pattern/deviations: WFL(Within Functional Limits)   Gait velocity interpretation: <1.31 ft/sec, indicative of household ambulator General Gait Details: moves slowly, frequent coughs and has to stop  Financial trader Rankin (Stroke Patients Only)       Balance Overall balance assessment: No apparent balance deficits (not formally assessed)                                           Pertinent Vitals/Pain Pain Assessment: Faces Faces Pain Scale: Hurts a little bit Pain Location: from coughing Pain Intervention(s): Monitored during session    Home Living Family/patient expects to be discharged to::  Private residence Living Arrangements: Alone Available Help at Discharge: Family;Available PRN/intermittently Type of Home: House Home Access: Stairs to enter   [employer] of Steps: 3 Home Layout: One level Home Equipment: None      Prior Function Level of Independence: Independent               Hand Dominance        Extremity/Trunk Assessment   Upper Extremity Assessment Upper Extremity Assessment: Overall WFL for tasks assessed    Lower Extremity Assessment Lower Extremity Assessment: Overall WFL for tasks assessed    Cervical / Trunk Assessment Cervical / Trunk Assessment: Normal  Communication   Communication: No difficulties  Cognition Arousal/Alertness: Awake/alert Behavior During Therapy: WFL for tasks assessed/performed Overall Cognitive Status: Within Functional Limits for tasks assessed                                        General Comments      Exercises     Assessment/Plan    PT Assessment Patient needs continued PT services  PT Problem List Decreased activity tolerance;Cardiopulmonary status limiting activity       PT Treatment Interventions Gait training;Functional mobility training;Therapeutic activities    PT Goals (Current goals can be found in the  Care Plan section)  Acute Rehab PT Goals Patient Stated Goal: to be able to breathe and go home PT Goal Formulation: With patient Time For Goal Achievement: 02/21/21 Potential to Achieve Goals: Good    Frequency Min 2X/week   Barriers to discharge        Co-evaluation               AM-PAC PT "6 Clicks" Mobility  Outcome Measure Help needed turning from your back to your side while in a flat bed without using bedrails?: None Help needed moving from lying on your back to sitting on the side of a flat bed without using bedrails?: None Help needed moving to and from a bed to a chair (including a wheelchair)?: None Help needed standing up from a  chair using your arms (e.g., wheelchair or bedside chair)?: None Help needed to walk in hospital room?: A Little Help needed climbing 3-5 steps with a railing? : A Little 6 Click Score: 22    End of Session   Activity Tolerance: Patient tolerated treatment well Patient left: in bed;with call bell/phone within reach;with family/visitor present Nurse Communication: Mobility status PT Visit Diagnosis: Difficulty in walking, not elsewhere classified (R26.2)    Time: 5170-0174 PT Time Calculation (min) (ACUTE ONLY): 21 min   Charges:   PT Evaluation $PT Eval Low Complexity: [place] Pager [phone] Office [phone]   Claretha Cooper 02/07/2021, 12:58 PM

## 2021-02-07 NOTE — ED Notes (Signed)
Patient is short of breath, continuously coughing with labored breathing when talking.

## 2021-02-08 DIAGNOSIS — D649 Anemia, unspecified: Secondary | ICD-10-CM | POA: Diagnosis not present

## 2021-02-08 DIAGNOSIS — J4541 Moderate persistent asthma with (acute) exacerbation: Secondary | ICD-10-CM | POA: Diagnosis not present

## 2021-02-08 DIAGNOSIS — E538 Deficiency of other specified B group vitamins: Secondary | ICD-10-CM | POA: Diagnosis not present

## 2021-02-08 LAB — COMPREHENSIVE METABOLIC PANEL
ALT: 13 U/L (ref 0–44)
AST: 14 U/L — ABNORMAL LOW (ref 15–41)
Albumin: 3.8 g/dL (ref 3.5–5.0)
Alkaline Phosphatase: 55 U/L (ref 38–126)
Anion gap: 5 (ref 5–15)
BUN: 14 mg/dL (ref 8–23)
CO2: 23 mmol/L (ref 22–32)
Calcium: 9.9 mg/dL (ref 8.9–10.3)
Chloride: 112 mmol/L — ABNORMAL HIGH (ref 98–111)
Creatinine, Ser: 0.74 mg/dL (ref 0.44–1.00)
GFR, Estimated: >60 mL/min (ref >60)
Glucose, Bld: 148 mg/dL — ABNORMAL HIGH (ref 70–99)
Potassium: 4.7 mmol/L (ref 3.5–5.1)
Sodium: 140 mmol/L (ref 135–145)
Total Bilirubin: 0.4 mg/dL (ref 0.3–1.2)
Total Protein: 7.2 g/dL (ref 6.5–8.1)

## 2021-02-08 LAB — MAGNESIUM: Magnesium: 2.3 mg/dL (ref 1.7–2.4)

## 2021-02-08 MED ORDER — FLUTICASONE-SALMETEROL 500-50 MCG/ACT IN AEPB: 1.0000 | INHALATION_SPRAY | Freq: Two times a day (BID) | RESPIRATORY_TRACT | 0 refills | Status: DC

## 2021-02-08 MED ORDER — PREDNISONE 10 MG PO TABS: ORAL_TABLET | ORAL | 0 refills | Status: DC

## 2021-02-08 MED ORDER — BENZONATATE 200 MG PO CAPS: 200.0000 mg | ORAL_CAPSULE | Freq: Three times a day (TID) | ORAL | 0 refills | Status: DC | PRN

## 2021-02-08 MED ORDER — VITAMIN B-12 1000 MCG PO TABS: 1000.0000 ug | ORAL_TABLET | Freq: Every day | ORAL | 0 refills | Status: AC

## 2021-02-08 MED ORDER — DEXTROMETHORPHAN-GUAIFENESIN 10-200 MG/5ML PO LIQD: 10.0000 mL | Freq: Four times a day (QID) | ORAL | 0 refills | Status: DC | PRN

## 2021-02-08 MED ORDER — PANTOPRAZOLE SODIUM 40 MG PO TBEC: 40.0000 mg | DELAYED_RELEASE_TABLET | Freq: Every day | ORAL | 0 refills | Status: DC

## 2021-02-08 NOTE — Discharge Summary (Signed)
Physician Discharge Summary  Sandra Carr YTK:[id number] DOB: [birth year] DOA: 02/06/2021  PCP: Vernie Shanks, MD  Admit date: 02/06/2021 Discharge date: 02/08/2021  Admitted From: Home Disposition: Home  Recommendations for Outpatient Follow-up:  1. Follow up with PCP within a week 2. Outpatient follow-up with pulmonary 3. Follow up in ED if symptoms worsen or new appear   Home Health: No Equipment/Devices: None  Discharge Condition: Stable CODE STATUS: Full Diet recommendation: Heart healthy  Brief/Interim Summary: 65 year old female with history of asthma, anemia presented with worsening shortness of breath progressively getting worse over the last 6 weeks.  She was treated with steroids twice as an outpatient by her PCP.  She reports inhaling Lysol more than a month ago.  On presentation, COVID test was negative.  She was unable to speak in full sentences.  Chest x-ray was negative for infiltrates.  She was started on IV Solu-Medrol.  Pulmonary was consulted and recommended slow taper of prednisone with outpatient follow-up with pulmonary.  She feels much better; pulmonary has cleared the patient for discharge.  She will be discharged home today.  Discharge Diagnoses:   Probable asthma exacerbation -Patient has had progressively worsening shortness of breath for the last 6 weeks despite oral steroid treatment x2 by her PCP.  She has already been evaluated by cardiology and GI as an outpatient - She reports inhaling Lysol more than a month ago.  On presentation, COVID test was negative.  She was unable to speak in full sentences.  Chest x-ray was negative for infiltrates.  She was started on IV Solu-Medrol. -Pulmonary was consulted and recommended slow taper of prednisone with outpatient follow-up with pulmonary.  She feels much better; pulmonary has cleared the patient for discharge.  She will be discharged home today on prednisone 40 mg daily for 3 days then 30 mg daily for 3 days  then 20 mg daily for 3 days then 10 mg daily for 3 days then stop as per pulmonary recommendations. -Currently on room air.  Chronic microcytic anemia -Hemoglobin stable.  Iron level 21.  Outpatient follow-up with GI  Hypertension -Continue amlodipine  Vitamin B12 deficiency -B12 level 147.    Will need oral supplementation on discharge.  Hypokalemia -Resolved   Discharge Instructions  Discharge Instructions    Ambulatory referral to Pulmonology   Complete by: As directed    Hospital follow-up   Reason for referral: Asthma/COPD   Diet - low sodium heart healthy   Complete by: As directed    Increase activity slowly   Complete by: As directed      Allergies as of 02/08/2021      Reactions   Other Hives, Anaphylaxis   Peanut-containing Drug Products Shortness Of Breath   Iohexol     Code: HIVES, Desc: patient claims hive from iv contrast. she drank omnipaque laced water oral contrast before informing anyone of an allergy (no problem from the oral at this time). no record of any enhancable procedures done in our system., Onset Date: [id number]   Latex Swelling   Methocarbamol Swelling   Shellfish Allergy Other (See Comments)   Mouth and Throat Swelling.   Triamcinolone Other (See Comments)   Hydrochlorothiazide Rash      Medication List    STOP taking these medications   budesonide 180 MCG/ACT inhaler Commonly known as: PULMICORT   Potassium Chloride ER 20 MEQ Tbcr     TAKE these medications   albuterol 108 (90 Base) MCG/ACT inhaler Commonly known as: VENTOLIN HFA  Inhale 2 puffs into the lungs every 6 (six) hours as needed for wheezing or shortness of breath (wheezing).   albuterol (2.5 MG/3ML) 0.083% nebulizer solution Commonly known as: PROVENTIL Take 2.5 mg by nebulization every 6 (six) hours as needed for wheezing or shortness of breath (wheezing).   amLODipine 5 MG tablet Commonly known as: NORVASC Take 5 mg by mouth daily.   benzonatate 200 MG  capsule Commonly known as: TESSALON Take 1 capsule (200 mg total) by mouth 3 (three) times daily as needed for cough.   Dextromethorphan-guaiFENesin 10-200 MG/5ML Liqd Take 10 mLs by mouth every 6 (six) hours as needed (cough).   fluticasone-salmeterol 500-50 MCG/ACT Aepb Commonly known as: ADVAIR Inhale 1 puff into the lungs in the morning and at bedtime.   pantoprazole 40 MG tablet Commonly known as: PROTONIX Take 1 tablet (40 mg total) by mouth daily. What changed: See the new instructions.   predniSONE 10 MG tablet Commonly known as: DELTASONE 40 mg daily x3 days then 30 mg daily x3 days then 20 mg daily x3 days then 10 mg daily x3 days then stop   sodium chloride 0.65 % Soln nasal spray Commonly known as: OCEAN Place 1 spray into both nostrils as needed for congestion.   vitamin B-12 1000 MCG tablet Commonly known as: CYANOCOBALAMIN Take 1 tablet (1,000 mcg total) by mouth daily.   zolpidem 10 MG tablet Commonly known as: AMBIEN Take 10 mg by mouth at bedtime as needed for sleep.       Follow-up Information    Parrett, Fonnie Mu, NP Follow up on 02/16/2021.   Specialty: Pulmonary Disease Why: 9:00 AM [facility] Contact information: [address] [place] [address] [phone]        Vernie Shanks, MD. Schedule an appointment as soon as possible for a visit in 1 week(s).   Specialty: Family Medicine Contact information: [place] [address] [phone]              Allergies  Allergen Reactions  . Other Hives and Anaphylaxis  . Peanut-Containing Drug Products Shortness Of Breath  . Iohexol      Code: HIVES, Desc: patient claims hive from iv contrast. she drank omnipaque laced water oral contrast before informing anyone of an allergy (no problem from the oral at this time). no record of any enhancable procedures done in our system., Onset Date: [id number]   . Latex Swelling  . Methocarbamol Swelling  .  Shellfish Allergy Other (See Comments)    Mouth and Throat Swelling.  . Triamcinolone Other (See Comments)  . Hydrochlorothiazide Rash    Consultations:  Pulmonary   Procedures/Studies: DG Chest Port 1 View  Result Date: 02/06/2021 CLINICAL DATA:  Shortness of breath. EXAM: PORTABLE CHEST 1 VIEW COMPARISON:  Jan 19, 2021. FINDINGS: The heart size and mediastinal contours are within normal limits. Both lungs are clear. The visualized skeletal structures are unremarkable. IMPRESSION: No active disease. Electronically Signed   By: Marijo Conception M.D.   On: 02/06/2021 16:43   DG Chest Portable 1 View  Result Date: 01/19/2021 CLINICAL DATA:  Shortness of breath EXAM: PORTABLE CHEST 1 VIEW COMPARISON:  07/22/2018 FINDINGS: The heart size and mediastinal contours are within normal limits. Both lungs are clear. Surgical hardware in the cervical spine. IMPRESSION: No active disease. Electronically Signed   By: Donavan Foil M.D.   On: 01/19/2021 20:01       Subjective: Patient seen and  examined at bedside.  She feels much better and thinks that she feels okay going home today.  No overnight fever, vomiting or worsening shortness of breath reported.  Still has intermittent cough.  Discharge Exam: Vitals:   02/08/21 0347 02/08/21 0814  BP: 119/69   Pulse: 86   Resp: 18   Temp: 97.8 F (36.6 C)   SpO2: 100% 98%    General: Pt is alert, awake, not in acute distress Cardiovascular: rate controlled, S1/S2 + Respiratory: bilateral decreased breath sounds at bases with some scattered crackles Abdominal: Soft, NT, ND, bowel sounds + Extremities: no edema, no cyanosis    The results of significant diagnostics from this hospitalization (including imaging, microbiology, ancillary and laboratory) are listed below for reference.     Microbiology: Recent Results (from the past 240 hour(s))  Resp Panel by RT-PCR (Flu A&B, Covid) Nasopharyngeal Swab     Status: None   Collection Time:  02/06/21  4:38 PM   Specimen: Nasopharyngeal Swab; Nasopharyngeal(NP) swabs in vial transport medium  Result Value Ref Range Status   SARS Coronavirus 2 by RT PCR NEGATIVE NEGATIVE Final    Comment: (NOTE) SARS-CoV-2 target nucleic acids are NOT DETECTED.  The SARS-CoV-2 RNA is generally detectable in upper respiratory specimens during the acute phase of infection. The lowest concentration of SARS-CoV-2 viral copies this assay can detect is 138 copies/mL. A negative result does not preclude SARS-Cov-2 infection and should not be used as the sole basis for treatment or other patient management decisions. A negative result may occur with  improper specimen collection/handling, submission of specimen other than nasopharyngeal swab, presence of viral mutation(s) within the areas targeted by this assay, and inadequate number of viral copies(<138 copies/mL). A negative result must be combined with clinical observations, patient history, and epidemiological information. The expected result is Negative.  Fact Sheet for Patients:  EntrepreneurPulse.com.au  Fact Sheet for Healthcare Providers:  IncredibleEmployment.be  This test is no t yet approved or cleared by the [place] FDA and  has been authorized for detection and/or diagnosis of SARS-CoV-2 by FDA under an Emergency Use Authorization (EUA). This EUA will remain  in effect (meaning this test can be used) for the duration of the COVID-19 declaration under Section 564(b)(1) of the Act, 21 U.S.C.section 360bbb-3(b)(1), unless the authorization is terminated  or revoked sooner.       Influenza A by PCR NEGATIVE NEGATIVE Final   Influenza B by PCR NEGATIVE NEGATIVE Final    Comment: (NOTE) The Xpert Xpress SARS-CoV-2/FLU/RSV plus assay is intended as an aid in the diagnosis of influenza from Nasopharyngeal swab specimens and should not be used as a sole basis for treatment. Nasal washings  and aspirates are unacceptable for Xpert Xpress SARS-CoV-2/FLU/RSV testing.  Fact Sheet for Patients: EntrepreneurPulse.com.au  Fact Sheet for Healthcare Providers: IncredibleEmployment.be  This test is not yet approved or cleared by the [place] FDA and has been authorized for detection and/or diagnosis of SARS-CoV-2 by FDA under an Emergency Use Authorization (EUA). This EUA will remain in effect (meaning this test can be used) for the duration of the COVID-19 declaration under Section 564(b)(1) of the Act, 21 U.S.C. section 360bbb-3(b)(1), unless the authorization is terminated or revoked.  Performed at [facility], [place] [address] [place], [place] [address]   MRSA PCR Screening     Status: None   Collection Time: 02/07/21 12:42 PM   Specimen: Nasopharyngeal  Result Value Ref Range Status   MRSA by PCR NEGATIVE  NEGATIVE Final    Comment:        The GeneXpert MRSA Assay (FDA approved for NASAL specimens only), is one component of a comprehensive MRSA colonization surveillance program. It is not intended to diagnose MRSA infection nor to guide or monitor treatment for MRSA infections. Performed at [facility], [place] [address], [place], [place] [address]      Labs: BNP (last 3 results) Recent Labs    02/06/21 1638  BNP 99.8   Basic Metabolic Panel: Recent Labs  Lab 02/06/21 1638 02/06/21 1816 02/07/21 0345 02/07/21 0452 02/08/21 0521  NA 140  --   --  138 140  K 2.8*  --  3.8 3.8 4.7  CL 107  --   --  112* 112*  CO2 24  --   --  16* 23  GLUCOSE 83  --   --  212* 148*  BUN 9  --   --  11 14  CREATININE 0.59  --   --  0.68 0.74  CALCIUM 9.5  --   --  9.5 9.9  MG  --  2.2  --  2.1 2.3  PHOS  --   --   --  2.0*  --    Liver Function Tests: Recent Labs  Lab 02/06/21 1638 02/07/21 0452 02/08/21 0521  AST 12* 17 14*  ALT 12 13 13   ALKPHOS 61 59 55  BILITOT 1.0 0.5 0.4   PROT 8.1 7.9 7.2  ALBUMIN 4.4 4.2 3.8   No results for input(s): LIPASE, AMYLASE in the last 168 hours. No results for input(s): AMMONIA in the last 168 hours. CBC: Recent Labs  Lab 02/06/21 1638 02/07/21 0452  WBC 9.3 10.9*  NEUTROABS 5.4 10.2*  HGB 11.2* 10.6*  HCT 35.9* 33.9*  MCV 67.9* 67.9*  PLT 394 375   Cardiac Enzymes: No results for input(s): CKTOTAL, CKMB, CKMBINDEX, TROPONINI in the last 168 hours. BNP: Invalid input(s): POCBNP CBG: No results for input(s): GLUCAP in the last 168 hours. D-Dimer Recent Labs    02/07/21 0133  DDIMER 1.06*   Hgb A1c No results for input(s): HGBA1C in the last 72 hours. Lipid Profile No results for input(s): CHOL, HDL, LDLCALC, TRIG, CHOLHDL, LDLDIRECT in the last 72 hours. Thyroid function studies Recent Labs    02/07/21 0452  TSH 0.486   Anemia work up Recent Labs    02/07/21 0133 02/07/21 0452  VITAMINB12 147*  --   FOLATE 22.0  --   FERRITIN 31  --   TIBC 416  --   IRON 21*  --   RETICCTPCT  --  1.2   Urinalysis    Component Value Date/Time   COLORURINE YELLOW 06/29/2010 2348   APPEARANCEUR CLEAR 06/29/2010 2348   LABSPEC 1.010 06/29/2010 2348   PHURINE 6.0 06/29/2010 2348   GLUCOSEU NEGATIVE 06/29/2010 2348   HGBUR NEGATIVE 06/29/2010 2348   BILIRUBINUR NEGATIVE 06/29/2010 2348   KETONESUR NEGATIVE 06/29/2010 2348   PROTEINUR NEGATIVE 06/29/2010 2348   UROBILINOGEN 0.2 06/29/2010 2348   NITRITE NEGATIVE 06/29/2010 2348   LEUKOCYTESUR  06/29/2010 2348    NEGATIVE MICROSCOPIC NOT DONE ON URINES WITH NEGATIVE PROTEIN, BLOOD, LEUKOCYTES, NITRITE, OR GLUCOSE <1000 mg/dL.   Sepsis Labs Invalid input(s): PROCALCITONIN,  WBC,  LACTICIDVEN Microbiology Recent Results (from the past 240 hour(s))  Resp Panel by RT-PCR (Flu A&B, Covid) Nasopharyngeal Swab     Status: None   Collection Time: 02/06/21  4:38 PM   Specimen: Nasopharyngeal  Swab; Nasopharyngeal(NP) swabs in vial transport medium  Result Value Ref  Range Status   SARS Coronavirus 2 by RT PCR NEGATIVE NEGATIVE Final    Comment: (NOTE) SARS-CoV-2 target nucleic acids are NOT DETECTED.  The SARS-CoV-2 RNA is generally detectable in upper respiratory specimens during the acute phase of infection. The lowest concentration of SARS-CoV-2 viral copies this assay can detect is 138 copies/mL. A negative result does not preclude SARS-Cov-2 infection and should not be used as the sole basis for treatment or other patient management decisions. A negative result may occur with  improper specimen collection/handling, submission of specimen other than nasopharyngeal swab, presence of viral mutation(s) within the areas targeted by this assay, and inadequate number of viral copies(<138 copies/mL). A negative result must be combined with clinical observations, patient history, and epidemiological information. The expected result is Negative.  Fact Sheet for Patients:  EntrepreneurPulse.com.au  Fact Sheet for Healthcare Providers:  IncredibleEmployment.be  This test is no t yet approved or cleared by the [place] FDA and  has been authorized for detection and/or diagnosis of SARS-CoV-2 by FDA under an Emergency Use Authorization (EUA). This EUA will remain  in effect (meaning this test can be used) for the duration of the COVID-19 declaration under Section 564(b)(1) of the Act, 21 U.S.C.section 360bbb-3(b)(1), unless the authorization is terminated  or revoked sooner.       Influenza A by PCR NEGATIVE NEGATIVE Final   Influenza B by PCR NEGATIVE NEGATIVE Final    Comment: (NOTE) The Xpert Xpress SARS-CoV-2/FLU/RSV plus assay is intended as an aid in the diagnosis of influenza from Nasopharyngeal swab specimens and should not be used as a sole basis for treatment. Nasal washings and aspirates are unacceptable for Xpert Xpress SARS-CoV-2/FLU/RSV testing.  Fact Sheet for  Patients: EntrepreneurPulse.com.au  Fact Sheet for Healthcare Providers: IncredibleEmployment.be  This test is not yet approved or cleared by the [place] FDA and has been authorized for detection and/or diagnosis of SARS-CoV-2 by FDA under an Emergency Use Authorization (EUA). This EUA will remain in effect (meaning this test can be used) for the duration of the COVID-19 declaration under Section 564(b)(1) of the Act, 21 U.S.C. section 360bbb-3(b)(1), unless the authorization is terminated or revoked.  Performed at [facility], [place] [address], [place], [place] [address]   MRSA PCR Screening     Status: None   Collection Time: 02/07/21 12:42 PM   Specimen: Nasopharyngeal  Result Value Ref Range Status   MRSA by PCR NEGATIVE NEGATIVE Final    Comment:        The GeneXpert MRSA Assay (FDA approved for NASAL specimens only), is one component of a comprehensive MRSA colonization surveillance program. It is not intended to diagnose MRSA infection nor to guide or monitor treatment for MRSA infections. Performed at [facility], [place] [address], [place], [place] [address]      Time coordinating discharge: 35 minutes  SIGNED:   Aline August, MD  [facility] 02/08/2021, 10:21 AM

## 2021-02-08 NOTE — Progress Notes (Signed)
Went over discharge instructions w/ pt and pt verbalized understanding.

## 2021-02-16 ENCOUNTER — Ambulatory Visit: Payer: Medicare Other | Admitting: Adult Health

## 2021-02-16 ENCOUNTER — Encounter: Payer: Self-pay | Admitting: Adult Health

## 2021-02-16 ENCOUNTER — Inpatient Hospital Stay: Payer: Medicare Other | Admitting: Adult Health

## 2021-02-16 ENCOUNTER — Other Ambulatory Visit: Payer: Self-pay

## 2021-02-16 VITALS — BP 120/60 | HR 90 | Ht 64.0 in | Wt 146.0 lb

## 2021-02-16 DIAGNOSIS — H9190 Unspecified hearing loss, unspecified ear: Secondary | ICD-10-CM | POA: Insufficient documentation

## 2021-02-16 DIAGNOSIS — M25539 Pain in unspecified wrist: Secondary | ICD-10-CM | POA: Insufficient documentation

## 2021-02-16 DIAGNOSIS — R002 Palpitations: Secondary | ICD-10-CM | POA: Insufficient documentation

## 2021-02-16 DIAGNOSIS — J309 Allergic rhinitis, unspecified: Secondary | ICD-10-CM | POA: Diagnosis not present

## 2021-02-16 DIAGNOSIS — M5412 Radiculopathy, cervical region: Secondary | ICD-10-CM | POA: Insufficient documentation

## 2021-02-16 DIAGNOSIS — M542 Cervicalgia: Secondary | ICD-10-CM | POA: Insufficient documentation

## 2021-02-16 DIAGNOSIS — F4323 Adjustment disorder with mixed anxiety and depressed mood: Secondary | ICD-10-CM | POA: Insufficient documentation

## 2021-02-16 DIAGNOSIS — G479 Sleep disorder, unspecified: Secondary | ICD-10-CM | POA: Insufficient documentation

## 2021-02-16 DIAGNOSIS — M799 Soft tissue disorder, unspecified: Secondary | ICD-10-CM | POA: Insufficient documentation

## 2021-02-16 DIAGNOSIS — R058 Other specified cough: Secondary | ICD-10-CM | POA: Insufficient documentation

## 2021-02-16 DIAGNOSIS — R7303 Prediabetes: Secondary | ICD-10-CM | POA: Insufficient documentation

## 2021-02-16 DIAGNOSIS — D509 Iron deficiency anemia, unspecified: Secondary | ICD-10-CM | POA: Insufficient documentation

## 2021-02-16 DIAGNOSIS — N3281 Overactive bladder: Secondary | ICD-10-CM | POA: Insufficient documentation

## 2021-02-16 DIAGNOSIS — K589 Irritable bowel syndrome without diarrhea: Secondary | ICD-10-CM | POA: Insufficient documentation

## 2021-02-16 DIAGNOSIS — M47812 Spondylosis without myelopathy or radiculopathy, cervical region: Secondary | ICD-10-CM | POA: Insufficient documentation

## 2021-02-16 DIAGNOSIS — R739 Hyperglycemia, unspecified: Secondary | ICD-10-CM | POA: Insufficient documentation

## 2021-02-16 DIAGNOSIS — J4541 Moderate persistent asthma with (acute) exacerbation: Secondary | ICD-10-CM

## 2021-02-16 DIAGNOSIS — R21 Rash and other nonspecific skin eruption: Secondary | ICD-10-CM | POA: Insufficient documentation

## 2021-02-16 DIAGNOSIS — R209 Unspecified disturbances of skin sensation: Secondary | ICD-10-CM | POA: Insufficient documentation

## 2021-02-16 DIAGNOSIS — M25519 Pain in unspecified shoulder: Secondary | ICD-10-CM | POA: Insufficient documentation

## 2021-02-16 DIAGNOSIS — E559 Vitamin D deficiency, unspecified: Secondary | ICD-10-CM | POA: Insufficient documentation

## 2021-02-16 DIAGNOSIS — M8580 Other specified disorders of bone density and structure, unspecified site: Secondary | ICD-10-CM | POA: Insufficient documentation

## 2021-02-16 DIAGNOSIS — K219 Gastro-esophageal reflux disease without esophagitis: Secondary | ICD-10-CM | POA: Insufficient documentation

## 2021-02-16 DIAGNOSIS — G4700 Insomnia, unspecified: Secondary | ICD-10-CM | POA: Insufficient documentation

## 2021-02-16 DIAGNOSIS — I10 Essential (primary) hypertension: Secondary | ICD-10-CM | POA: Insufficient documentation

## 2021-02-16 DIAGNOSIS — M5430 Sciatica, unspecified side: Secondary | ICD-10-CM | POA: Insufficient documentation

## 2021-02-16 DIAGNOSIS — K2940 Chronic atrophic gastritis without bleeding: Secondary | ICD-10-CM | POA: Insufficient documentation

## 2021-02-16 NOTE — Assessment & Plan Note (Signed)
Recent acute asthmatic exacerbation after exposure to chemical (Lysol) Patient has an allergic phenotype with previously elevated eosinophils She is currently on prednisone.  We will have her finish up the steroids.  Check PFTs and consider an allergy profile with IgE on return Continue on maintenance therapy with Advair.  Along with trigger prevention/daily Zyrtec.  Plan  Patient Instructions  Finish Prednisone as directed.  Continue on Advair 1 puff daily, rinse after use.  Albuterol inhaler As needed   Continue on Zyrtec 10mg  At bedtime   Follow up with Dr. Shearon Stalls or Keyshia Orwick NP in 6-8 weeks with PFT (30 min slot )  Please contact office for sooner follow up if symptoms do not improve or worsen or seek emergency care

## 2021-02-16 NOTE — Patient Instructions (Addendum)
Finish Prednisone as directed.  Continue on Advair 1 puff daily, rinse after use.  Albuterol inhaler As needed   Continue on Zyrtec 10mg  At bedtime   Follow up with Dr. Shearon Stalls or Jelena Malicoat NP in 6-8 weeks with PFT (30 min slot )  Please contact office for sooner follow up if symptoms do not improve or worsen or seek emergency care

## 2021-02-16 NOTE — Progress Notes (Signed)
@Patient  ID: Rea College, female    DOB: 29-Dec-1955, 65 y.o.   MRN: [id number]  Chief Complaint  Patient presents with   Follow-up    Referring provider: Vernie Shanks, MD  HPI: 65 year old female former smoker seen for pulmonary consult during hospitalization February 07, 2021 for asthma exacerbation  TEST/EVENTS :   02/16/2021 Follow up : Asthma , post hospital follow-up Patient presents for a post hospital follow-up.  Patient was recently admitted earlier this month for a asthma exacerbation after exposure to cleaning products. (Lysol)  Patient has had asthma lifelong . Previously took allergy shots. Over last few years, asthma doing well, had no flares . Had weaned her Advair daily. 2 months doing so well stopped her Advair .  Lab work showed elevated eosinophils-absolute count at 800 on admission Hospital records were reviewed in detail.  Patient was recommended to restart Advair.  Was given a steroid taper.  And recommended for pulmonary outpatient work-up.  Chest x-ray was clear. Since discharge patient is feeling much better. Cough and wheezing are less. Has some residual cough . No fever, chest pain , hemoptysis . Still has few days left of prednisone .  Takes zyrtec 10mg  daily . Says allergies well controlled.  Lives at home alone, Active . Exercises 5 days a week.   Retired Marketing executive work. Quit smoking 1980s (5 pk year hx ) .     Allergies  Allergen Reactions   Other Hives and Anaphylaxis   Peanut-Containing Drug Products Shortness Of Breath   Contrast Media [Iodinated Diagnostic Agents]    Iohexol      Code: HIVES, Desc: patient claims hive from iv contrast. she drank omnipaque laced water oral contrast before informing anyone of an allergy (no problem from the oral at this time). no record of any enhancable procedures done in our system., Onset Date: [id number]    Latex Swelling   Methocarbamol Swelling   Shellfish Allergy Other (See Comments)    Mouth and Throat  Swelling.   Triamcinolone Other (See Comments)   Hydrochlorothiazide Rash    Immunization History  Administered Date(s) Administered   Influenza Split 07/05/2015   Influenza,inj,quad, With Preservative 07/07/2014, 05/11/2019   PFIZER(Purple Top)SARS-COV-2 Vaccination 11/01/2019, 11/22/2019, 07/08/2020   Pneumococcal Polysaccharide-23 11/14/2002   Td 11/14/2002   Tdap 01/03/2012   Zoster, Live 11/23/2015, 05/05/2020, 08/14/2020    Past Medical History:  Diagnosis Date   Asthma     Tobacco History: Social History   Tobacco Use  Smoking Status Former   Packs/day: 0.25   Years: 20.00   Pack years: 5.00   Types: Cigarettes   Quit date: 1998   Years since quitting: 24.4  Smokeless Tobacco Never   Counseling given: Not Answered   Outpatient Medications Prior to Visit  Medication Sig Dispense Refill   albuterol (PROVENTIL HFA;VENTOLIN HFA) 108 (90 BASE) MCG/ACT inhaler Inhale 2 puffs into the lungs every 6 (six) hours as needed for wheezing or shortness of breath (wheezing).     albuterol (PROVENTIL) (2.5 MG/3ML) 0.083% nebulizer solution Take 2.5 mg by nebulization every 6 (six) hours as needed for wheezing or shortness of breath (wheezing).     amLODipine (NORVASC) 5 MG tablet Take 5 mg by mouth daily.     benzonatate (TESSALON) 200 MG capsule Take 1 capsule (200 mg total) by mouth 3 (three) times daily as needed for cough. 30 capsule 0   Dextromethorphan-guaiFENesin 10-200 MG/5ML LIQD Take 10 mLs by mouth every 6 (six) hours as needed (  cough). 237 mL 0   fluticasone-salmeterol (ADVAIR) 500-50 MCG/ACT AEPB Inhale 1 puff into the lungs in the morning and at bedtime. 60 each 0   pantoprazole (PROTONIX) 40 MG tablet Take 1 tablet (40 mg total) by mouth daily. 30 tablet 0   predniSONE (DELTASONE) 10 MG tablet 40 mg daily x3 days then 30 mg daily x3 days then 20 mg daily x3 days then 10 mg daily x3 days then stop 30 tablet 0   sodium chloride (OCEAN) 0.65 % SOLN nasal spray Place  1 spray into both nostrils as needed for congestion.     vitamin B-12 (CYANOCOBALAMIN) 1000 MCG tablet Take 1 tablet (1,000 mcg total) by mouth daily. 30 tablet 0   zolpidem (AMBIEN) 10 MG tablet Take 10 mg by mouth at bedtime as needed for sleep.     No facility-administered medications prior to visit.     Review of Systems:   Constitutional:   No  weight loss, night sweats,  Fevers, chills, fatigue, or  lassitude.  HEENT:   No headaches,  Difficulty swallowing,  Tooth/dental problems, or  Sore throat,                No sneezing, itching, ear ache, nasal congestion, post nasal drip,   CV:  No chest pain,  Orthopnea, PND, swelling in lower extremities, anasarca, dizziness, palpitations, syncope.   GI  No heartburn, indigestion, abdominal pain, nausea, vomiting, diarrhea, change in bowel habits, loss of appetite, bloody stools.   Resp:  No chest wall deformity  Skin: no rash or lesions.  GU: no dysuria, change in color of urine, no urgency or frequency.  No flank pain, no hematuria   MS:  No joint pain or swelling.  No decreased range of motion.  No back pain.    Physical Exam  BP 120/60   Pulse 90   Ht 5\' 4"  (1.626 m)   Wt 146 lb (66.2 kg)   LMP 05/18/1993   SpO2 96%   BMI 25.06 kg/m   GEN: A/Ox3; pleasant , NAD, well nourished    HEENT:  [place]/AT,  , NOSE-clear, THROAT-clear, no lesions, no postnasal drip or exudate noted.   NECK:  Supple w/ fair ROM; no JVD; normal carotid impulses w/o bruits; no thyromegaly or nodules palpated; no lymphadenopathy.    RESP  Clear  P & A; w/o, wheezes/ rales/ or rhonchi. no accessory muscle use, no dullness to percussion  CARD:  RRR, no m/r/g, no peripheral edema, pulses intact, no cyanosis or clubbing.  GI:   Soft & nt; nml bowel sounds; no organomegaly or masses detected.   Musco: Warm bil, no deformities or joint swelling noted.   Neuro: alert, no focal deficits noted.    Skin: Warm, no lesions or rashes    Lab  Results:  CBC  No results found for: PROBNP  Imaging: DG Chest Port 1 View  Result Date: 02/06/2021 CLINICAL DATA:  Shortness of breath. EXAM: PORTABLE CHEST 1 VIEW COMPARISON:  Jan 19, 2021. FINDINGS: The heart size and mediastinal contours are within normal limits. Both lungs are clear. The visualized skeletal structures are unremarkable. IMPRESSION: No active disease. Electronically Signed   By: Marijo Conception M.D.   On: 02/06/2021 16:43   DG Chest Portable 1 View  Result Date: 01/19/2021 CLINICAL DATA:  Shortness of breath EXAM: PORTABLE CHEST 1 VIEW COMPARISON:  07/22/2018 FINDINGS: The heart size and mediastinal contours are within normal limits. Both lungs are clear. Surgical hardware  in the cervical spine. IMPRESSION: No active disease. Electronically Signed   By: Donavan Foil M.D.   On: 01/19/2021 20:01      No flowsheet data found.  No results found for: NITRICOXIDE      Assessment & Plan:   Asthma exacerbation Recent acute asthmatic exacerbation after exposure to chemical (Lysol) Patient has an allergic phenotype with previously elevated eosinophils She is currently on prednisone.  We will have her finish up the steroids.  Check PFTs and consider an allergy profile with IgE on return Continue on maintenance therapy with Advair.  Along with trigger prevention/daily Zyrtec.  Plan  Patient Instructions  Finish Prednisone as directed.  Continue on Advair 1 puff daily, rinse after use.  Albuterol inhaler As needed   Continue on Zyrtec 10mg  At bedtime   Follow up with Dr. Shearon Stalls or Venice Marcucci NP in 6-8 weeks with PFT (30 min slot )  Please contact office for sooner follow up if symptoms do not improve or worsen or seek emergency care       Allergic rhinitis Continue on Zyrtec daily   I spent   30 minutes dedicated to the care of this patient on the date of this encounter to include pre-visit review of records, face-to-face time with the patient discussing  conditions above, post visit ordering of testing, clinical documentation with the electronic health record, making appropriate referrals as documented, and communicating necessary findings to members of the patients care team.    Rexene Edison, NP 02/16/2021

## 2021-02-16 NOTE — Assessment & Plan Note (Signed)
Continue on Zyrtec daily

## 2021-04-04 ENCOUNTER — Ambulatory Visit (INDEPENDENT_AMBULATORY_CARE_PROVIDER_SITE_OTHER): Payer: Medicare Other | Admitting: Internal Medicine

## 2021-04-04 ENCOUNTER — Ambulatory Visit: Payer: Medicare Other | Admitting: Adult Health

## 2021-04-04 ENCOUNTER — Encounter: Payer: Self-pay | Admitting: Adult Health

## 2021-04-04 ENCOUNTER — Other Ambulatory Visit: Payer: Self-pay

## 2021-04-04 DIAGNOSIS — J4540 Moderate persistent asthma, uncomplicated: Secondary | ICD-10-CM

## 2021-04-04 DIAGNOSIS — J309 Allergic rhinitis, unspecified: Secondary | ICD-10-CM

## 2021-04-04 DIAGNOSIS — J45909 Unspecified asthma, uncomplicated: Secondary | ICD-10-CM | POA: Insufficient documentation

## 2021-04-04 DIAGNOSIS — J4541 Moderate persistent asthma with (acute) exacerbation: Secondary | ICD-10-CM | POA: Diagnosis not present

## 2021-04-04 LAB — PULMONARY FUNCTION TEST
DL/VA % pred: 149 %
DL/VA: 6.23 ml/min/mmHg/L
DLCO cor % pred: 123 %
DLCO cor: 24.4 ml/min/mmHg
DLCO unc % pred: 111 %
DLCO unc: 22.01 ml/min/mmHg
FEF 25-75 Post: 1.19 L/sec
FEF 25-75 Pre: 0.93 L/sec
FEF2575-%Change-Post: 27 %
FEF2575-%Pred-Post: 64 %
FEF2575-%Pred-Pre: 50 %
FEV1-%Change-Post: 7 %
FEV1-%Pred-Post: 92 %
FEV1-%Pred-Pre: 86 %
FEV1-Post: 1.8 L
FEV1-Pre: 1.67 L
FEV1FVC-%Change-Post: 7 %
FEV1FVC-%Pred-Pre: 84 %
FEV6-%Change-Post: 0 %
FEV6-%Pred-Post: 106 %
FEV6-%Pred-Pre: 106 %
FEV6-Post: 2.55 L
FEV6-Pre: 2.55 L
FEV6FVC-%Pred-Post: 103 %
FEV6FVC-%Pred-Pre: 103 %
FVC-%Change-Post: 0 %
FVC-%Pred-Post: 102 %
FVC-%Pred-Pre: 102 %
FVC-Post: 2.55 L
FVC-Pre: 2.55 L
Post FEV1/FVC ratio: 71 %
Post FEV6/FVC ratio: 100 %
Pre FEV1/FVC ratio: 66 %
Pre FEV6/FVC Ratio: 100 %
RV % pred: 101 %
RV: 2.13 L
TLC % pred: 95 %
TLC: 4.83 L

## 2021-04-04 NOTE — Progress Notes (Signed)
Full PFT performed today. °

## 2021-04-04 NOTE — Patient Instructions (Addendum)
Continue on Advair 1 puff Twice daily   Albuterol inhaler As needed   Continue on Zyrtec '10mg'$  At bedtime   Follow up with Dr. Shearon Stalls or Kosei Rhodes NP in 3-4 months and As needed   Please contact office for sooner follow up if symptoms do not improve or worsen or seek emergency care

## 2021-04-04 NOTE — Progress Notes (Signed)
$'@Patient'y$  ID: Rea [employer], female    DOB: [birth year], 65 y.o.   MRN: [id number]  Chief Complaint  Patient presents with   Follow-up    Referring provider: Vernie Shanks, MD  HPI: 65 year old female former smoker seen for pulmonary consult during hospitalization February 07, 2021 for asthma exacerbation  TEST/EVENTS :   04/04/2021 Follow up : Asthma  Patient presents for 6-week follow-up.  Patient was hospitalized early June 2022 for an asthma exacerbation.  This occurred after being exposed to cleaning products with Lysol.  Patient has had lifelong asthma.  She previously took allergy shots.  Prior to admission she was taking Advair daily.  Lab work showed elevated eosinophils with an absolute count at 800 on admission.  During hospitalization she was treated with IV steroids and nebulized bronchodilators.  Her chest x-ray was clear.  Patient also is on Zyrtec daily.  She typically is very active.  Exercises most days of the week. Patient was set up for pulmonary function testing which shows mild airflow obstruction with an FEV1 at 92%, ratio 71, FVC 102%, no significant bronchodilator change.,  DLCO 111%. Since last visit doing well.  She has had no flare of cough or wheezing.  He has had no albuterol use.  Is back to her baseline activities.  Is exercising Monday through Friday.    Allergies  Allergen Reactions   Other Hives and Anaphylaxis   Peanut-Containing Drug Products Shortness Of Breath   Contrast Media [Iodinated Diagnostic Agents]    Iohexol      Code: HIVES, Desc: patient claims hive from iv contrast. she drank omnipaque laced water oral contrast before informing anyone of an allergy (no problem from the oral at this time). no record of any enhancable procedures done in our system., Onset Date: [id number]    Latex Swelling   Methocarbamol Swelling   Shellfish Allergy Other (See Comments)    Mouth and Throat Swelling.   Triamcinolone Other (See Comments)    Hydrochlorothiazide Rash    Immunization History  Administered Date(s) Administered   Influenza Split 07/05/2015   Influenza,inj,quad, With Preservative 07/07/2014, 05/11/2019   PFIZER(Purple Top)SARS-COV-2 Vaccination 11/01/2019, 11/22/2019, 07/08/2020   Pneumococcal Polysaccharide-23 11/14/2002   Td 11/14/2002   Tdap 01/03/2012   Zoster, Live 11/23/2015, 05/05/2020, 08/14/2020    Past Medical History:  Diagnosis Date   Asthma     Tobacco History: Social History   Tobacco Use  Smoking Status Former   Packs/day: 0.25   Years: 20.00   Pack years: 5.00   Types: Cigarettes   Quit date: 1998   Years since quitting: 24.5  Smokeless Tobacco Never   Counseling given: Not Answered   Outpatient Medications Prior to Visit  Medication Sig Dispense Refill   albuterol (PROVENTIL HFA;VENTOLIN HFA) 108 (90 BASE) MCG/ACT inhaler Inhale 2 puffs into the lungs every 6 (six) hours as needed for wheezing or shortness of breath (wheezing).     albuterol (PROVENTIL) (2.5 MG/3ML) 0.083% nebulizer solution Take 2.5 mg by nebulization every 6 (six) hours as needed for wheezing or shortness of breath (wheezing).     amLODipine (NORVASC) 5 MG tablet Take 5 mg by mouth daily.     pantoprazole (PROTONIX) 40 MG tablet Take 1 tablet (40 mg total) by mouth daily. 30 tablet 0   sodium chloride (OCEAN) 0.65 % SOLN nasal spray Place 1 spray into both nostrils as needed for congestion.     vitamin B-12 (CYANOCOBALAMIN) 1000 MCG tablet Take 1 tablet (1,000 mcg  total) by mouth daily. 30 tablet 0   zolpidem (AMBIEN) 10 MG tablet Take 10 mg by mouth at bedtime as needed for sleep.     fluticasone-salmeterol (ADVAIR) 500-50 MCG/ACT AEPB Inhale 1 puff into the lungs in the morning and at bedtime. 60 each 0   benzonatate (TESSALON) 200 MG capsule Take 1 capsule (200 mg total) by mouth 3 (three) times daily as needed for cough. (Patient not taking: Reported on 04/04/2021) 30 capsule 0   Dextromethorphan-guaiFENesin  10-200 MG/5ML LIQD Take 10 mLs by mouth every 6 (six) hours as needed (cough). (Patient not taking: Reported on 04/04/2021) 237 mL 0   doxycycline (VIBRA-TABS) 100 MG tablet Take 100 mg by mouth 2 (two) times daily. (Patient not taking: Reported on 04/04/2021)     potassium chloride SA (KLOR-CON) 20 MEQ tablet Take 20 mEq by mouth daily. (Patient not taking: Reported on 04/04/2021)     predniSONE (DELTASONE) 10 MG tablet 40 mg daily x3 days then 30 mg daily x3 days then 20 mg daily x3 days then 10 mg daily x3 days then stop (Patient not taking: Reported on 04/04/2021) 30 tablet 0   No facility-administered medications prior to visit.     Review of Systems:   Constitutional:   No  weight loss, night sweats,  Fevers, chills, fatigue, or  lassitude.  HEENT:   No headaches,  Difficulty swallowing,  Tooth/dental problems, or  Sore throat,                No sneezing, itching, ear ache, nasal congestion, post nasal drip,   CV:  No chest pain,  Orthopnea, PND, swelling in lower extremities, anasarca, dizziness, palpitations, syncope.   GI  No heartburn, indigestion, abdominal pain, nausea, vomiting, diarrhea, change in bowel habits, loss of appetite, bloody stools.   Resp: No shortness of breath with exertion or at rest.  No excess mucus, no productive cough,  No non-productive cough,  No coughing up of blood.  No change in color of mucus.  No wheezing.  No chest wall deformity  Skin: no rash or lesions.  GU: no dysuria, change in color of urine, no urgency or frequency.  No flank pain, no hematuria   MS:  No joint pain or swelling.  No decreased range of motion.  No back pain.    Physical Exam  BP 112/64 (BP Location: Left Arm, Patient Position: Sitting, Cuff Size: Normal)   Pulse 90   Temp 98.1 F (36.7 C) (Oral)   Resp 16   Ht '5\' 4"'$  (1.626 m)   Wt 149 lb (67.6 kg)   LMP 05/18/1993   BMI 25.58 kg/m   GEN: A/Ox3; pleasant , NAD, well nourished    HEENT:  [place]/AT,   , NOSE-clear,  THROAT-clear, no lesions, no postnasal drip or exudate noted.   NECK:  Supple w/ fair ROM; no JVD; normal carotid impulses w/o bruits; no thyromegaly or nodules palpated; no lymphadenopathy.    RESP  Clear  P & A; w/o, wheezes/ rales/ or rhonchi. no accessory muscle use, no dullness to percussion  CARD:  RRR, no m/r/g, no peripheral edema, pulses intact, no cyanosis or clubbing.  GI:   Soft & nt; nml bowel sounds; no organomegaly or masses detected.   Musco: Warm bil, no deformities or joint swelling noted.   Neuro: alert, no focal deficits noted.    Skin: Warm, no lesions or rashes    Lab Results:    BMET   ProBNP No  results found for: PROBNP  Imaging: No results found.    PFT Results Latest Ref Rng & Units 04/04/2021  FVC-Pre L 2.55  FVC-Predicted Pre % 102  FVC-Post L 2.55  FVC-Predicted Post % 102  Pre FEV1/FVC % % 66  Post FEV1/FCV % % 71  FEV1-Pre L 1.67  FEV1-Predicted Pre % 86  FEV1-Post L 1.80  DLCO uncorrected ml/min/mmHg 22.01  DLCO UNC% % 111  DLCO corrected ml/min/mmHg 24.40  DLCO COR %Predicted % 123  DLVA Predicted % 149  TLC L 4.83  TLC % Predicted % 95  RV % Predicted % 101    No results found for: NITRICOXIDE      Assessment & Plan:   Asthma Moderate persistent asthma with improved control on current regimen.  Pulmonary function testings were reviewed in detail and showed minimum airflow obstruction.  Patient is very active and is doing well currently.  Asthma action plan discussed.  Patient has an incentive spirometer at home levels have been good.  Plan  PLAN: Patient Instructions  Continue on Advair 1 puff Twice daily   Albuterol inhaler As needed   Continue on Zyrtec '10mg'$  At bedtime   Follow up with Dr. Shearon Stalls or Liisa Picone NP in 3-4 months and As needed   Please contact office for sooner follow up if symptoms do not improve or worsen or seek emergency care       Allergic rhinitis Continue on current regimen.     Rexene Edison, NP 04/04/2021

## 2021-04-04 NOTE — Assessment & Plan Note (Signed)
Moderate persistent asthma with improved control on current regimen.  Pulmonary function testings were reviewed in detail and showed minimum airflow obstruction.  Patient is very active and is doing well currently.  Asthma action plan discussed.  Patient has an incentive spirometer at home levels have been good.  Plan  PLAN: Patient Instructions  Continue on Advair 1 puff Twice daily   Albuterol inhaler As needed   Continue on Zyrtec '10mg'$  At bedtime   Follow up with Dr. Shearon Stalls or Anand Tejada NP in 3-4 months and As needed   Please contact office for sooner follow up if symptoms do not improve or worsen or seek emergency care

## 2021-04-04 NOTE — Assessment & Plan Note (Signed)
Continue on current regimen .   

## 2021-04-04 NOTE — Patient Instructions (Signed)
Full PFT performed today. °

## 2021-05-29 ENCOUNTER — Telehealth: Payer: Self-pay | Admitting: Adult Health

## 2021-05-29 DIAGNOSIS — J4540 Moderate persistent asthma, uncomplicated: Secondary | ICD-10-CM

## 2021-05-29 NOTE — Telephone Encounter (Signed)
I have called the pt and she stated that her daughter is taking her on a cruise and she is needing a smaller more portable nebulizer machine.  TP please advise. Thanks  Pt was last seen on 04/04/21 by TP

## 2021-05-29 NOTE — Telephone Encounter (Signed)
Left message for patient to return call.

## 2021-05-29 NOTE — Telephone Encounter (Signed)
That is fine 

## 2021-05-30 NOTE — Telephone Encounter (Signed)
Patient returned call.  Patient requested a portable neb machine to take on her cruise.  Per Lynelle Smoke, NP, ok to place order.  DME order placed.  Nothing further at this time.

## 2021-06-18 ENCOUNTER — Encounter: Payer: Self-pay | Admitting: Podiatry

## 2021-06-18 ENCOUNTER — Other Ambulatory Visit: Payer: Self-pay

## 2021-06-18 ENCOUNTER — Ambulatory Visit: Payer: Medicare Other | Admitting: Podiatry

## 2021-06-18 ENCOUNTER — Other Ambulatory Visit: Payer: Self-pay | Admitting: Podiatry

## 2021-06-18 ENCOUNTER — Ambulatory Visit (INDEPENDENT_AMBULATORY_CARE_PROVIDER_SITE_OTHER): Payer: Medicare Other

## 2021-06-18 DIAGNOSIS — E210 Primary hyperparathyroidism: Secondary | ICD-10-CM | POA: Insufficient documentation

## 2021-06-18 DIAGNOSIS — S99921A Unspecified injury of right foot, initial encounter: Secondary | ICD-10-CM

## 2021-06-18 DIAGNOSIS — M810 Age-related osteoporosis without current pathological fracture: Secondary | ICD-10-CM | POA: Insufficient documentation

## 2021-06-18 DIAGNOSIS — M722 Plantar fascial fibromatosis: Secondary | ICD-10-CM

## 2021-06-18 MED ORDER — MELOXICAM 15 MG PO TABS: 15.0000 mg | ORAL_TABLET | Freq: Every day | ORAL | 0 refills | Status: AC

## 2021-06-18 MED ORDER — DEXAMETHASONE SODIUM PHOSPHATE 120 MG/30ML IJ SOLN
4.0000 mg | Freq: Once | INTRAMUSCULAR | Status: AC
Start: 2021-06-18 — End: 2021-06-18
  Administered 2021-06-18: 4 mg via INTRA_ARTICULAR

## 2021-06-18 NOTE — Progress Notes (Signed)
  Subjective:  Patient ID: Sandra Carr, female    DOB: 03/02/1956,   MRN: [id number]  Chief Complaint  Patient presents with   Foot Pain    I woke up Saturday morning and could not put pressure on it and did some exercises Friday and there is some swelling    65 y.o. female presents for  left heel that started all of the sudden several days ago. Relates she had been exercising before and noticed some swelling. Has been walking on her tip toes and using a crutch for the pain. Denies any other treatments. She is a controlled diabetic.  Marland Kitchen Denies any other pedal complaints. Denies n/v/f/c.   Past Medical History:  Diagnosis Date   Asthma     Objective:  Physical Exam: Vascular: DP/PT pulses 2/4 bilateral. CFT <3 seconds. Normal hair growth on digits. No edema.  Skin. No lacerations or abrasions bilateral feet.  Musculoskeletal: MMT 5/5 bilateral lower extremities in DF, PF, Inversion and Eversion. Deceased ROM in DF of ankle joint. Exquisitely tender to medial calcaneal tubercle. Tender with DF of the ankle.  Neurological: Sensation intact to light touch.   Assessment:   1. Plantar fasciitis of right foot      Plan:  Patient was evaluated and treated and all questions answered. Discussed plantar fasciitis with patient.  X-rays reviewed and discussed with patient. No acute fractures or dislocations noted. Mild spurring noted at inferior calcaneus.  Discussed treatment options including, ice, NSAIDS, supportive shoes, bracing, and stretching. Stretching exercises provided to be done on a daily basis.   Prescription for meloxicam provided and sent to pharmacy. Patient requesting injection today. Procedure note below.   CAM boot provided.  Follow-up 4 weeks or sooner if any problems arise. In the meantime, encouraged to call the office with any questions, concerns, change in symptoms.   Procedure:  Discussed etiology, pathology, conservative vs. surgical therapies. At this time a  plantar fascial injection was recommended.  The patient agreed and a sterile skin prep was applied.  An injection consisting of  dexamethasone and marcaine mixture was infiltrated at the point of maximal tenderness on the right Heel.  Bandaid applied. The patient tolerated this well and was given instructions for aftercare.    Lorenda Peck, DPM

## 2021-06-18 NOTE — Patient Instructions (Signed)

## 2021-06-20 ENCOUNTER — Telehealth: Payer: Self-pay | Admitting: Adult Health

## 2021-06-20 NOTE — Telephone Encounter (Signed)
Fine to order. But it may not be covered by insurance. In that case she will have to pay out of pocket.

## 2021-06-20 NOTE — Telephone Encounter (Signed)
Called and spoke with pt. Pt states that she wants to try to get a travel nebulizer machine. Prior machine she got from [facility], but they do not carry travel neb machines.  Dr. Shearon Stalls, please advise if you are okay with [place] placing DME order for pt to receive a travel nebulizer.

## 2021-06-20 NOTE — Telephone Encounter (Signed)
Call made to patient, confirmed DOB. Made of [place] recommendations. Patient states she will look for one on amaz[employer].   Nothing further needed at this time.

## 2021-07-16 ENCOUNTER — Encounter: Payer: Self-pay | Admitting: Podiatry

## 2021-07-16 ENCOUNTER — Other Ambulatory Visit: Payer: Self-pay

## 2021-07-16 ENCOUNTER — Ambulatory Visit: Payer: Medicare Other | Admitting: Podiatry

## 2021-07-16 DIAGNOSIS — M722 Plantar fascial fibromatosis: Secondary | ICD-10-CM | POA: Diagnosis not present

## 2021-07-16 NOTE — Progress Notes (Signed)
  Subjective:  Patient ID: Sandra Carr, female    DOB: 1956/07/12,   MRN: [id number]  Chief Complaint  Patient presents with   Foot Pain    The boot does help and does hurt my right hip some and I try and stay off of the foot as much as possible    65 y.o. female presents for  left heel pain follow-up. Relates she is doing about 95% better. States the boot helped but starting to hurt her hips so she was just staying off her feet. Has been stretching and taking meloxicam. Relates injection helped. She is a controlled diabetic . Denies any other pedal complaints. Denies n/v/f/c.   Past Medical History:  Diagnosis Date   Asthma     Objective:  Physical Exam: Vascular: DP/PT pulses 2/4 bilateral. CFT <3 seconds. Normal hair growth on digits. No edema.  Skin. No lacerations or abrasions bilateral feet.  Musculoskeletal: MMT 5/5 bilateral lower extremities in DF, PF, Inversion and Eversion. Deceased ROM in DF of ankle joint. Mildly tender to medial calcaneal tubercle. No pain with DF of ankle.  Neurological: Sensation intact to light touch.   Assessment:   1. Plantar fasciitis of right foot       Plan:  Patient was evaluated and treated and all questions answered. Discussed plantar fasciitis with patient.  X-rays reviewed and discussed with patient. No acute fractures or dislocations noted. Mild spurring noted at inferior calcaneus.  Continue stretching and supportive shoes.  May take meloxicam as needed.  Discontinue boot and transition to regular shoes.  Follow-up as needed.      Lorenda Peck, DPM

## 2021-10-23 ENCOUNTER — Other Ambulatory Visit: Payer: Self-pay | Admitting: Family Medicine

## 2021-10-24 ENCOUNTER — Other Ambulatory Visit: Payer: Self-pay | Admitting: Family Medicine

## 2021-10-24 DIAGNOSIS — R1030 Lower abdominal pain, unspecified: Secondary | ICD-10-CM

## 2021-10-26 ENCOUNTER — Other Ambulatory Visit: Payer: Medicare Other

## 2021-11-20 ENCOUNTER — Other Ambulatory Visit: Payer: Self-pay

## 2021-11-20 ENCOUNTER — Ambulatory Visit
Admission: RE | Admit: 2021-11-20 | Discharge: 2021-11-20 | Disposition: A | Discharge status: Home / Self Care | Payer: Medicare Other | Source: Ambulatory Visit | Attending: Family Medicine | Admitting: Family Medicine

## 2021-11-20 DIAGNOSIS — R1030 Lower abdominal pain, unspecified: Secondary | ICD-10-CM

## 2022-02-15 ENCOUNTER — Other Ambulatory Visit: Payer: Self-pay

## 2022-02-15 ENCOUNTER — Emergency Department (HOSPITAL_BASED_OUTPATIENT_CLINIC_OR_DEPARTMENT_OTHER): Payer: Medicare Other

## 2022-02-15 ENCOUNTER — Emergency Department (HOSPITAL_BASED_OUTPATIENT_CLINIC_OR_DEPARTMENT_OTHER)
Admission: EM | Admit: 2022-02-15 | Discharge: 2022-02-16 | Disposition: A | Discharge status: Home / Self Care | Payer: Medicare Other | Attending: Emergency Medicine | Admitting: Emergency Medicine

## 2022-02-15 ENCOUNTER — Encounter (HOSPITAL_BASED_OUTPATIENT_CLINIC_OR_DEPARTMENT_OTHER): Payer: Self-pay | Admitting: Emergency Medicine

## 2022-02-15 DIAGNOSIS — Z91010 Allergy to peanuts: Secondary | ICD-10-CM | POA: Insufficient documentation

## 2022-02-15 DIAGNOSIS — R0602 Shortness of breath: Secondary | ICD-10-CM | POA: Diagnosis not present

## 2022-02-15 DIAGNOSIS — E876 Hypokalemia: Secondary | ICD-10-CM | POA: Insufficient documentation

## 2022-02-15 DIAGNOSIS — J45909 Unspecified asthma, uncomplicated: Secondary | ICD-10-CM | POA: Insufficient documentation

## 2022-02-15 DIAGNOSIS — R0781 Pleurodynia: Secondary | ICD-10-CM | POA: Insufficient documentation

## 2022-02-15 DIAGNOSIS — Z7951 Long term (current) use of inhaled steroids: Secondary | ICD-10-CM | POA: Insufficient documentation

## 2022-02-15 DIAGNOSIS — Z79899 Other long term (current) drug therapy: Secondary | ICD-10-CM | POA: Diagnosis not present

## 2022-02-15 DIAGNOSIS — R052 Subacute cough: Secondary | ICD-10-CM | POA: Diagnosis not present

## 2022-02-15 DIAGNOSIS — I10 Essential (primary) hypertension: Secondary | ICD-10-CM | POA: Insufficient documentation

## 2022-02-15 DIAGNOSIS — Z91040 Latex allergy status: Secondary | ICD-10-CM | POA: Diagnosis not present

## 2022-02-15 LAB — CBC WITH DIFFERENTIAL/PLATELET
Abs Immature Granulocytes: 0.02 10*3/uL (ref 0.00–0.07)
Basophils Absolute: 0.1 10*3/uL (ref 0.0–0.1)
Basophils Relative: 1 %
Eosinophils Absolute: 0.4 10*3/uL (ref 0.0–0.5)
Eosinophils Relative: 4 %
HCT: 37.6 % (ref 36.0–46.0)
Hemoglobin: 12.4 g/dL (ref 12.0–15.0)
Immature Granulocytes: 0 %
Lymphocytes Relative: 30 %
Lymphs Abs: 2.9 10*3/uL (ref 0.7–4.0)
MCH: 22.6 pg — ABNORMAL LOW (ref 26.0–34.0)
MCHC: 33 g/dL (ref 30.0–36.0)
MCV: 68.5 fL — ABNORMAL LOW (ref 80.0–100.0)
Monocytes Absolute: 0.6 10*3/uL (ref 0.1–1.0)
Monocytes Relative: 6 %
Neutro Abs: 5.8 10*3/uL (ref 1.7–7.7)
Neutrophils Relative %: 59 %
Platelets: 373 10*3/uL (ref 150–400)
RBC: 5.49 MIL/uL — ABNORMAL HIGH (ref 3.87–5.11)
RDW: 17.3 % — ABNORMAL HIGH (ref 11.5–15.5)
WBC: 9.8 10*3/uL (ref 4.0–10.5)
nRBC: 0 % (ref 0.0–0.2)

## 2022-02-15 LAB — BASIC METABOLIC PANEL
Anion gap: 7 (ref 5–15)
BUN: 12 mg/dL (ref 8–23)
CO2: 26 mmol/L (ref 22–32)
Calcium: 9.4 mg/dL (ref 8.9–10.3)
Chloride: 109 mmol/L (ref 98–111)
Creatinine, Ser: 0.87 mg/dL (ref 0.44–1.00)
GFR, Estimated: >60 mL/min (ref >60)
Glucose, Bld: 103 mg/dL — ABNORMAL HIGH (ref 70–99)
Potassium: 3.4 mmol/L — ABNORMAL LOW (ref 3.5–5.1)
Sodium: 142 mmol/L (ref 135–145)

## 2022-02-15 LAB — D-DIMER, QUANTITATIVE: D-Dimer, Quant: 0.62 ug/mL-FEU — ABNORMAL HIGH (ref 0.00–0.50)

## 2022-02-15 LAB — TROPONIN I (HIGH SENSITIVITY): Troponin I (High Sensitivity): 2 ng/L (ref <18)

## 2022-02-15 LAB — BRAIN NATRIURETIC PEPTIDE: B Natriuretic Peptide: 5.8 pg/mL (ref 0.0–100.0)

## 2022-02-15 MED ORDER — METHYLPREDNISOLONE 4 MG PO TBPK: ORAL_TABLET | ORAL | 0 refills | Status: DC

## 2022-02-15 MED ORDER — DIPHENHYDRAMINE HCL 50 MG/ML IJ SOLN
50.0000 mg | Freq: Once | INTRAMUSCULAR | Status: DC
  Filled 2022-02-15: qty 1

## 2022-02-15 MED ORDER — DIPHENHYDRAMINE HCL 25 MG PO CAPS
50.0000 mg | ORAL_CAPSULE | Freq: Once | ORAL | Status: DC
  Filled 2022-02-15: qty 2

## 2022-02-15 MED ORDER — METHYLPREDNISOLONE SODIUM SUCC 40 MG IJ SOLR
40.0000 mg | Freq: Once | INTRAMUSCULAR | Status: AC
  Administered 2022-02-15: 40 mg via INTRAVENOUS
  Filled 2022-02-15: qty 1

## 2022-02-15 MED ORDER — ALBUTEROL SULFATE HFA 108 (90 BASE) MCG/ACT IN AERS
2.0000 | INHALATION_SPRAY | RESPIRATORY_TRACT | Status: DC | PRN
  Filled 2022-02-15: qty 6.7

## 2022-02-15 NOTE — Discharge Instructions (Signed)
Follow-up with the pulm doctor  Return for new or worsening symptoms

## 2022-02-15 NOTE — ED Triage Notes (Signed)
Patient arrived via POV c/o shortness of breath x 2 weeks. Patient states being seen for same, with no relief. Patient states being placed on prednisone with some relief, with return of symptoms. Patient is AO x 4, VS WDL, normal gait.

## 2022-02-15 NOTE — ED Provider Notes (Signed)
[facility] Provider Note   CSN: [id number] Arrival date & time: 02/15/22  2022     History  Chief Complaint  Patient presents with   Shortness of Breath    Sandra Carr is a 66 y.o. female here for evaluation of shortness of breath and pleuritic chest pain.  Symptoms been ongoing for months however worse over the last few weeks.  Has been seen by PCP multiple times for same.  Was given short course of steroids which slightly helped however symptoms did not completely resolved.  She recently returned from [place] after a plane trip.  No lower extremity swelling or pain.  No history of PE or DVT.  No PND, orthopnea.  She admits to a cough which is productive of light yellow sputum for weeks.  Stakes she intermittently uses her albuterol inhaler.  She does not take her Advair daily however uses it as needed.  Does use nebulizers at home almost daily.  She denies any exertional chest pain or shortness of breath.  HPI     Home Medications Prior to Admission medications   Medication Sig Start Date End Date Taking? Authorizing Provider  methylPREDNISolone (MEDROL DOSEPAK) 4 MG TBPK tablet Take as prescribed 02/15/22  Yes Brittie Whisnant A, PA-C  albuterol (PROVENTIL HFA;VENTOLIN HFA) 108 (90 BASE) MCG/ACT inhaler Inhale 2 puffs into the lungs every 6 (six) hours as needed for wheezing or shortness of breath (wheezing).    [provider]  albuterol (PROVENTIL) (2.5 MG/3ML) 0.083% nebulizer solution Take 2.5 mg by nebulization every 6 (six) hours as needed for wheezing or shortness of breath (wheezing).    [provider]  amLODipine (NORVASC) 5 MG tablet Take 5 mg by mouth daily.    [provider]  cyclobenzaprine (FLEXERIL) 10 MG tablet Take 10 mg by mouth 3 (three) times daily. 06/15/21   [provider]  EPINEPHrine 0.3 mg/0.3 mL IJ SOAJ injection Inject into the muscle as needed. 05/01/21   [provider]   fluticasone-salmeterol (ADVAIR) 500-50 MCG/ACT AEPB Inhale 1 puff into the lungs in the morning and at bedtime. 02/08/21 03/10/21  Aline August, MD  meloxicam (MOBIC) 15 MG tablet Take 1 tablet (15 mg total) by mouth daily. 06/18/21   Lorenda Peck, DPM  pantoprazole (PROTONIX) 40 MG tablet Take 1 tablet (40 mg total) by mouth daily. 02/08/21   Aline August, MD  Potassium Chloride ER 20 MEQ TBCR Take 1 tablet by mouth daily. 05/27/21   [provider]  sodium chloride (OCEAN) 0.65 % SOLN nasal spray Place 1 spray into both nostrils as needed for congestion.    [provider]  vitamin B-12 (CYANOCOBALAMIN) 1000 MCG tablet Take 1 tablet (1,000 mcg total) by mouth daily. 02/08/21   Aline August, MD  zolpidem (AMBIEN) 10 MG tablet Take 10 mg by mouth at bedtime as needed for sleep. 01/31/21   [provider]      Allergies    Other, Peanut-containing drug products, Contrast media [iodinated contrast media], Iohexol, Latex, Methocarbamol, Shellfish allergy, Triamcinolone, and Hydrochlorothiazide    Review of Systems   Review of Systems  Constitutional: Negative.   HENT: Negative.    Respiratory:  Positive for cough and shortness of breath. Negative for apnea, choking, chest tightness, wheezing and stridor.   Cardiovascular:  Positive for chest pain (pleuritic).  Gastrointestinal: Negative.   Genitourinary: Negative.   Musculoskeletal: Negative.   Neurological: Negative.   All other systems reviewed and are negative.  Physical Exam Updated Vital Signs BP 140/84   Pulse 74   Temp 97.6 F (36.4 C)   Resp (!) 22   Ht '5\' 4"'$  (1.626 m)   Wt 59.4 kg   LMP 05/18/1993   SpO2 100%   BMI 22.49 kg/m  Physical Exam Vitals and nursing note reviewed.  Constitutional:      General: She is not in acute distress.    Appearance: She is well-developed. She is not ill-appearing, toxic-appearing or diaphoretic.  HENT:     Head: Normocephalic and atraumatic.  Eyes:      Pupils: Pupils are equal, round, and reactive to light.  Cardiovascular:     Rate and Rhythm: Normal rate.     Pulses: Normal pulses.          Radial pulses are 2+ on the right side and 2+ on the left side.       Dorsalis pedis pulses are 2+ on the right side and 2+ on the left side.     Heart sounds: Normal heart sounds.  Pulmonary:     Effort: Pulmonary effort is normal. No respiratory distress.     Breath sounds: Normal breath sounds. No decreased breath sounds, wheezing or rhonchi.     Comments: Clear bilaterally, speaks in full sentences without difficulty. Chest:     Comments: Mild tenderness anterior posterior chest wall Abdominal:     General: Bowel sounds are normal. There is no distension.     Palpations: Abdomen is soft.     Comments: Soft, Nontender  Musculoskeletal:        General: Normal range of motion.     Cervical back: Normal range of motion.     Right lower leg: No tenderness. No edema.     Left lower leg: No tenderness. No edema.     Comments: No bony tenderness, moves all 4 extremities, compartment soft  Skin:    General: Skin is warm and dry.     Capillary Refill: Capillary refill takes less than 2 seconds.     Comments: No rashes or lesions  Neurological:     General: No focal deficit present.     Mental Status: She is alert.  Psychiatric:        Mood and Affect: Mood normal.    ED Results / Procedures / Treatments   Labs (all labs ordered are listed, but only abnormal results are displayed) Labs Reviewed  CBC WITH DIFFERENTIAL/PLATELET - Abnormal; Notable for the following components:      Result Value   RBC 5.49 (*)    MCV 68.5 (*)    MCH 22.6 (*)    RDW 17.3 (*)    All other components within normal limits  BASIC METABOLIC PANEL - Abnormal; Notable for the following components:   Potassium 3.4 (*)    Glucose, Bld 103 (*)    All other components within normal limits  D-DIMER, QUANTITATIVE - Abnormal; Notable for the following components:    D-Dimer, Quant 0.62 (*)    All other components within normal limits  BRAIN NATRIURETIC PEPTIDE  TROPONIN I (HIGH SENSITIVITY)    EKG EKG Interpretation  Date/Time:  Friday February 15 2022 20:45:09 EDT Ventricular Rate:  79 PR Interval:  175 QRS Duration: 89 QT Interval:  377 QTC Calculation: 433 R Axis:   -28 Text Interpretation: Sinus rhythm Borderline left axis deviation No significant change since last tracing Confirmed by Blanchie Dessert [phone]) on 02/15/2022 8:58:07 PM  Radiology DG Chest 2  View  Result Date: 02/15/2022 CLINICAL DATA:  Shortness of breath. EXAM: CHEST - 2 VIEW COMPARISON:  Chest x-ray 02/06/2021 FINDINGS: The heart size and mediastinal contours are within normal limits. Both lungs are clear. Cervical spinal fusion plate is present. No acute fractures are seen. IMPRESSION: No active cardiopulmonary disease. Electronically Signed   By: Ronney Asters M.D.   On: 02/15/2022 21:09    Procedures Procedures    Medications Ordered in ED Medications  albuterol (VENTOLIN HFA) 108 (90 Base) MCG/ACT inhaler 2 puff (has no administration in time range)  diphenhydrAMINE (BENADRYL) capsule 50 mg (has no administration in time range)    Or  diphenhydrAMINE (BENADRYL) injection 50 mg (has no administration in time range)  methylPREDNISolone sodium succinate (SOLU-MEDROL) 40 mg/mL injection 40 mg (40 mg Intravenous Given 02/15/22 2147)   ED Course/ Medical Decision Making/ A&P    66 year old history of hypertension, GERD, asthma here for evaluation of shortness of breath over the last few weeks, worsening over the last few days.  Was recently in [place] around the air pollution.  She does wear a mask.  She has had a cough over the last few weeks productive of light yellow sputum.  No PND, orthopnea.  She does not appear grossly fluid overloaded.  She has been seen by PCP for this multiple times, shortness of breath thought due to asthma exacerbation, given steroids with only mild  improvement.  She is not currently on steroids.  Of note it does sound like patient does not use her Advair as prescribed and uses it more as a rescue inhaler.  Cannot PERC.  Labs and imaging personally viewed and interpreted:  Chest x-ray without infiltrates, cardiomegaly, pulm edema, pneumothorax CBC no leukocytosis, hemoglobin stable BMP hypokalemia at 3.4 BNP 5.8 Ddimer 0.62> age adjusted within normal limits Trop 2 EKG no ischemic changes  Patient reassessed.  I discussed proper use of her Advair, daily as well as using nebulizers as needed.  Unclear why she has been having shortness of breath for several months as well as cough.  She has normal vital signs.  Request referral to pulmonology for further evaluation which I have placed.  Will try longer steroid taper however she has no current wheezing at this time.  Low suspicion for acute ACS, PE, dissection, bacterial infectious process.  Discussed follow-up with pulmonology, PCP return for new or worsening symptoms which patient and family agreeable for.  Unclear if her recent worsening of her shortness of breath due to being in [place] during all of the [place] and the poor air quality at that time.  The patient has been appropriately medically screened and/or stabilized in the ED. I have low suspicion for any other emergent medical condition which would require further screening, evaluation or treatment in the ED or require inpatient management.  Patient is hemodynamically stable and in no acute distress.  Patient able to ambulate in department prior to ED.  Evaluation does not show acute pathology that would require ongoing or additional emergent interventions while in the emergency department or further inpatient treatment.  I have discussed the diagnosis with the patient and answered all questions.  Pain is been managed while in the emergency department and patient has no further complaints prior to discharge.  Patient  is comfortable with plan discussed in room and is stable for discharge at this time.  I have discussed strict return precautions for returning to the emergency department.  Patient was encouraged to follow-up  with PCP/specialist refer to at discharge.                            Medical Decision Making Amount and/or Complexity of Data Reviewed External Data Reviewed: labs, radiology, ECG and notes. Labs: ordered. Decision-making details documented in ED Course. Radiology: ordered and independent interpretation performed. Decision-making details documented in ED Course. ECG/medicine tests: ordered and independent interpretation performed. Decision-making details documented in ED Course.  Risk OTC drugs. Prescription drug management. Parenteral controlled substances. Decision regarding hospitalization. Diagnosis or treatment significantly limited by social determinants of health.          Final Clinical Impression(s) / ED Diagnoses Final diagnoses:  SOB (shortness of breath)  Subacute cough    Rx / DC Orders ED Discharge Orders          Ordered    Ambulatory referral to Pulmonology        02/15/22 2311    methylPREDNISolone (MEDROL DOSEPAK) 4 MG TBPK tablet        02/15/22 2311              Jonnie Truxillo A, PA-C 02/15/22 2320    Blanchie Dessert, MD 02/18/22 2151

## 2022-02-15 NOTE — ED Notes (Signed)
Pt. Reports she has had issues with breathing and her cough for several mths.  Pt. Reports she also went on a long trip just recently.  Pt. Has had increased shob

## 2022-03-05 ENCOUNTER — Other Ambulatory Visit: Payer: Self-pay | Admitting: Endocrinology

## 2022-03-05 DIAGNOSIS — E210 Primary hyperparathyroidism: Secondary | ICD-10-CM

## 2022-04-03 ENCOUNTER — Other Ambulatory Visit: Payer: Self-pay | Admitting: Obstetrics and Gynecology

## 2022-04-03 ENCOUNTER — Ambulatory Visit
Admission: RE | Admit: 2022-04-03 | Discharge: 2022-04-03 | Disposition: A | Discharge status: Home / Self Care | Payer: Medicare Other | Source: Ambulatory Visit | Attending: Endocrinology | Admitting: Endocrinology

## 2022-04-03 DIAGNOSIS — Z1231 Encounter for screening mammogram for malignant neoplasm of breast: Secondary | ICD-10-CM

## 2022-04-03 DIAGNOSIS — E210 Primary hyperparathyroidism: Secondary | ICD-10-CM

## 2022-04-11 ENCOUNTER — Ambulatory Visit: Payer: Medicare Other | Admitting: Internal Medicine

## 2022-04-11 ENCOUNTER — Encounter: Payer: Self-pay | Admitting: Internal Medicine

## 2022-04-11 VITALS — BP 120/70 | HR 85 | Ht 64.25 in | Wt 134.4 lb

## 2022-04-11 DIAGNOSIS — D7219 Other eosinophilia: Secondary | ICD-10-CM

## 2022-04-11 DIAGNOSIS — J301 Allergic rhinitis due to pollen: Secondary | ICD-10-CM

## 2022-04-11 DIAGNOSIS — J4550 Severe persistent asthma, uncomplicated: Secondary | ICD-10-CM

## 2022-04-11 NOTE — Patient Instructions (Addendum)
Please schedule follow up scheduled with myself in 3 months.  If my schedule is not open yet, we will contact you with a reminder closer to that time. Please call [phone] if you haven't heard from [place] a month before.   Blood work today on your way out.  Continue advair and albuterol as needed.  Continue claritin as needed for allergies.   You can look into getting HEPA filter for your HVAC system at home to help with air purification.   By learning about asthma and how it can be controlled, you take an important step toward managing this disease. Work closely with your asthma care team to learn all you can about your asthma, how to avoid triggers, what your medications do, and how to take them correctly. With proper care, you can live free of asthma symptoms and maintain a normal, healthy lifestyle.   What is asthma? Asthma is a chronic disease that affects the airways of the lungs. During normal breathing, the bands of muscle that surround the airways are relaxed and air moves freely. During an asthma episode or "attack," there are three main changes that stop air from moving easily through the airways: The bands of muscle that surround the airways tighten and make the airways narrow. This tightening is called bronchospasm.  The lining of the airways becomes swollen or inflamed.  The cells that line the airways produce more mucus, which is thicker than normal and clogs the airways.  These three factors - bronchospasm, inflammation, and mucus production - cause symptoms such as difficulty breathing, wheezing, and coughing.  What are the most common symptoms of asthma? Asthma symptoms are not the same for everyone. They can even change from episode to episode in the same person. Also, you may have only one symptom of asthma, such as cough, but another person may have all the symptoms of asthma. It is important to know all the symptoms of asthma and to be aware that your asthma can present  in any of these ways at any time. The most common symptoms include: Coughing, especially at night  Shortness of breath  Wheezing  Chest tightness, pain, or pressure   Who is affected by asthma? Asthma affects 22 million Americans; about 6 million of these are children under age 65. People who have a family history of asthma have an increased risk of developing the disease. Asthma is also more common in people who have allergies or who are exposed to tobacco smoke. However, anyone can develop asthma at any time. Some people may have asthma all of their lives, while others may develop it as adults.  What causes asthma? The airways in a person with asthma are very sensitive and react to many things, or "triggers." Contact with these triggers causes asthma symptoms. One of the most important parts of asthma control is to identify your triggers and then avoid them when possible. The only trigger you do not want to avoid is exercise. Pre-treatment with medicines before exercise can allow you to stay active yet avoid asthma symptoms. Common asthma triggers include: Infections (colds, viruses, flu, sinus infections)  Exercise  Weather (changes in temperature and/or humidity, cold air)  Tobacco smoke  Allergens (dust mites, pollens, pets, mold spores, cockroaches, and sometimes foods)  Irritants (strong odors from cleaning products, perfume, wood smoke, air pollution)  Strong emotions such as crying or laughing hard  Some medications   How is asthma diagnosed? To diagnose asthma, your doctor will first review  your medical history, family history, and symptoms. Your doctor will want to know any past history of breathing problems you may have had, as well as a family history of asthma, allergies, eczema (a bumpy, itchy skin rash caused by allergies), or other lung disease. It is important that you describe your symptoms in detail (cough, wheeze, shortness of breath, chest tightness), including when and  how often they occur. The doctor will perform a physical examination and listen to your heart and lungs. He or she may also order breathing tests, allergy tests, blood tests, and chest and sinus X-rays. The tests will find out if you do have asthma and if there are any other conditions that are contributing factors.  How is asthma treated? Asthma can be controlled, but not cured. It is not normal to have frequent symptoms, trouble sleeping, or trouble completing tasks. Appropriate asthma care will prevent symptoms and visits to the emergency room and hospital. Asthma medicines are one of the mainstays of asthma treatment. The drugs used to treat asthma are explained below.  Anti-inflammatories: These are the most important drugs for most people with asthma. Anti-inflammatory drugs reduce swelling and mucus production in the airways. As a result, airways are less sensitive and less likely to react to triggers. These medications need to be taken daily and may need to be taken for several weeks before they begin to control asthma. Anti-inflammatory medicines lead to fewer symptoms, better airflow, less sensitive airways, less airway damage, and fewer asthma attacks. If taken every day, they CONTROL or prevent asthma symptoms.   Bronchodilators: These drugs relax the muscle bands that tighten around the airways. This action opens the airways, letting more air in and out of the lungs and improving breathing. Bronchodilators also help clear mucus from the lungs. As the airways open, the mucus moves more freely and can be coughed out more easily. In short-acting forms, bronchodilators RELIEVE or stop asthma symptoms by quickly opening the airways and are very helpful during an asthma episode. In long-acting forms, bronchodilators provide CONTROL of asthma symptoms and prevent asthma episodes.  Asthma drugs can be taken in a variety of ways. Inhaling the medications by using a metered dose inhaler, dry powder  inhaler, or nebulizer is one way of taking asthma medicines. Oral medicines (pills or liquids you swallow) may also be prescribed.  Asthma severity Asthma is classified as either "intermittent" (comes and goes) or "persistent" (lasting). Persistent asthma is further described as being mild, moderate, or severe. The severity of asthma is based on how often you have symptoms both during the day and night, as well as by the results of lung function tests and by how well you can perform activities. The "severity" of asthma refers to how "intense" or "strong" your asthma is.  Asthma control Asthma control is the goal of asthma treatment. Regardless of your asthma severity, it may or may not be controlled. Asthma control means: You are able to do everything you want to do at work and home  You have no (or minimal) asthma symptoms  You do not wake up from your sleep or earlier than usual in the morning due to asthma  You rarely need to use your reliever medicine (inhaler)  Another major part of your treatment is that you are happy with your asthma care and believe your asthma is controlled.  Monitoring symptoms A key part of treatment is keeping track of how well your lungs are working. Monitoring your symptoms, what they are,  how and when they happen, and how severe they are, is an important part of being able to control your asthma.  Sometimes asthma is monitored using a peak flow meter. A peak flow (PF) meter measures how fast the air comes out of your lungs. It can help you know when your asthma is getting worse, sometimes even before you have symptoms. By taking daily peak flow readings, you can learn when to adjust medications to keep asthma under good control. It is also used to create your asthma action plan (see below). Your doctor can use your peak flow readings to adjust your treatment plan in some cases.  Asthma Action Plan Based on your history and asthma severity, you and your doctor will  develop a care plan called an "asthma action plan." The asthma action plan describes when and how to use your medicines, actions to take when asthma worsens, and when to seek emergency care. Make sure you understand this plan. If you do not, ask your asthma care provider any questions you may have. Your asthma action plan is one of the keys to controlling asthma. Keep it readily available to remind you of what you need to do every day to control asthma and what you need to do when symptoms occur.  Goals of asthma therapy These are the goals of asthma treatment: Live an active, normal life  Prevent chronic and troublesome symptoms  Attend work or school every day  Perform daily activities without difficulty  Stop urgent visits to the doctor, emergency department, or hospital  Use and adjust medications to control asthma with few or no side effects

## 2022-04-11 NOTE — Progress Notes (Signed)
Sandra Carr    [id number]    [birth year]  Primary Care Physician:Wong, Edwyna Shell, MD (Inactive) Date of Appointment: 04/11/2022 Established Patient Visit  Chief complaint:   Chief Complaint  Patient presents with   Follow-up    Follow-up: Yearly     HPI: Sandra Carr is a 66 y.o. with history of asthma previously seen by Rexene Edison NP  Interval Updates: Here for follow up visit and to establish care with me for the first time. Has had asthma since childhood.   Has needed steroids twice for her breathing since January.  Before that needed 2-3 times.   Feels triggers are usually seasonal allergies and dog in her home.    Current Regimen: advair 500 1 puff twice a day. Uses albuterol inhaler less than once/week.  Asthma Triggers: seasonal allergies, chemical smells, cleaning smells.  Exacerbations in the last year: 6 in the last year History of hospitalization or intubation: hospitalized in June 2022, has had multiple hosptalizations previously.  Allergy Testing: previously was on SCIT in the 1980s GERD: denies Allergic Rhinitis: prn anti-histamine.  ACT:      No data to display         FeNO:  Lives at home with mother and dog.  She is allergic to dogs and lives with a malti-poo (it's her mom's dog.)  I have reviewed the patient's family social and past medical history and updated as appropriate.   Past Medical History:  Diagnosis Date   Asthma     Past Surgical History:  Procedure Laterality Date   ABDOMINAL HYSTERECTOMY     FOOT SURGERY Left     Family History  Problem Relation Age of Onset   Breast cancer Cousin    Hypertension Mother    Diabetes Mother    Hypertension Father    Heart failure Brother    Heart disease Brother     Social History   Occupational History   Not on file  Tobacco Use   Smoking status: Former    Packs/day: 0.25    Years: 20.00    Total pack years: 5.00    Types: Cigarettes    Quit date: 1998     Years since quitting: 25.6   Smokeless tobacco: Never  Vaping Use   Vaping Use: Never used  Substance and Sexual Activity   Alcohol use: Yes    Comment: occ wine   Drug use: Never   Sexual activity: Not on file     Physical Exam: Blood pressure 120/70, pulse 85, height 5' 4.25" (1.632 m), weight 134 lb 6.4 oz (61 kg), last menstrual period 05/18/1993, SpO2 97 %.  Gen:      No acute distress ENT:  no nasal polyps, mucus membranes moist Lungs:    No increased respiratory effort, symmetric chest wall excursion, clear to auscultation bilaterally, no wheezes or crackles CV:         Regular rate and rhythm; no murmurs, rubs, or gallops.  No pedal edema   Data Reviewed: Imaging: I have personally reviewed the Chest xray June 2023 - no acute process  PFTs:     Latest Ref Rng & Units 04/04/2021   10:57 AM  PFT Results  FVC-Pre L 2.55   FVC-Predicted Pre % 102   FVC-Post L 2.55   FVC-Predicted Post % 102   Pre FEV1/FVC % % 66   Post FEV1/FCV % % 71   FEV1-Pre L 1.67   FEV1-Predicted  Pre % 86   FEV1-Post L 1.80   DLCO uncorrected ml/min/mmHg 22.01   DLCO UNC% % 111   DLCO corrected ml/min/mmHg 24.40   DLCO COR %Predicted % 123   DLVA Predicted % 149   TLC L 4.83   TLC % Predicted % 95   RV % Predicted % 101    I have personally reviewed the patient's PFTs and mild airflow limitation no significant bronchodilator response.   Labs:  Immunization status: Immunization History  Administered Date(s) Administered   Influenza Split 07/05/2015   Influenza,inj,quad, With Preservative 07/07/2014, 05/11/2019   PFIZER(Purple Top)SARS-COV-2 Vaccination 11/01/2019, 11/22/2019, 07/08/2020   PNEUMOCOCCAL CONJUGATE-20 03/09/2021   Pfizer Covid-19 Vaccine Bivalent Booster 2yr & up 06/14/2021   Pneumococcal Polysaccharide-23 11/14/2002   Td 11/14/2002   Tdap 01/03/2012, 03/20/2022   Zoster, Live 11/23/2015, 05/05/2020, 08/14/2020    External Records Personally Reviewed:  pulmonary, ED  Assessment:  Severe persistent asthma, poorly controlled with >5 prednisone courses in the last year.  Allergic rhinitis Food allergy anaphylaxis Peripheral eosinophilia  Plan/Recommendations: Continue advair 500 1 puff twice a day Continue albuterol as needed. Continue claritin as needed.  Advised HEPA filters in home due to ongoing trigger exposure from dog. Will obtain region 2 allergy panel as I suspect environmental allergen exposure is her primary driver for poor control. She feels like she is fine today and wants to continue current therapy. Based on her frequency of flares last year I think she needs to follow up sooner than a year and may even require biologic therapy.    Return to Care: Return in about 3 months (around 07/12/2022).   NLenice Llamas MD [facility]

## 2022-04-12 LAB — RESPIRATORY ALLERGY PROFILE REGION II ~~LOC~~
Allergen, A. alternata, m6: 0.4 kU/L — ABNORMAL HIGH
Allergen, Cedar tree, t12: 2.49 kU/L — ABNORMAL HIGH
Allergen, Comm Silver Birch, t9: 14.2 kU/L — ABNORMAL HIGH
Allergen, Cottonwood, t14: 3.88 kU/L — ABNORMAL HIGH
Allergen, D pternoyssinus,d7: 24.7 kU/L — ABNORMAL HIGH
Allergen, Mouse Urine Protein, e78: 0.78 kU/L — ABNORMAL HIGH
Allergen, Mulberry, t76: 0.11 kU/L — ABNORMAL HIGH
Allergen, Oak,t7: 23 kU/L — ABNORMAL HIGH
Allergen, P. notatum, m1: <0.1 kU/L
Aspergillus fumigatus, m3: 2.22 kU/L — ABNORMAL HIGH
Bermuda Grass: 1.93 kU/L — ABNORMAL HIGH
Box Elder IgE: 1.72 kU/L — ABNORMAL HIGH
CLADOSPORIUM HERBARUM (M2) IGE: <0.1 kU/L
COMMON RAGWEED (SHORT) (W1) IGE: 6.78 kU/L — ABNORMAL HIGH
Cat Dander: 2.39 kU/L — ABNORMAL HIGH
Class: 0
Class: 0
Class: 1
Class: 2
Class: 2
Class: 2
Class: 2
Class: 2
Class: 2
Class: 2
Class: 2
Class: 2
Class: 3
Class: 3
Class: 3
Class: 3
Class: 3
Class: 3
Class: 4
Class: 4
Class: 4
Class: 4
Class: 4
Cockroach: 0.93 kU/L — ABNORMAL HIGH
D. farinae: 30.8 kU/L — ABNORMAL HIGH
Dog Dander: 36.3 kU/L — ABNORMAL HIGH
Elm IgE: 9.95 kU/L — ABNORMAL HIGH
IgE (Immunoglobulin E), Serum: 1043 kU/L — ABNORMAL HIGH (ref <=114)
Johnson Grass: 6.19 kU/L — ABNORMAL HIGH
Pecan/Hickory Tree IgE: 28.3 kU/L — ABNORMAL HIGH
Rough Pigweed  IgE: 2.26 kU/L — ABNORMAL HIGH
Sheep Sorrel IgE: 1.88 kU/L — ABNORMAL HIGH
Timothy Grass: 4.65 kU/L — ABNORMAL HIGH

## 2022-04-12 LAB — INTERPRETATION:

## 2022-04-23 ENCOUNTER — Ambulatory Visit
Admission: RE | Admit: 2022-04-23 | Discharge: 2022-04-23 | Disposition: A | Discharge status: Home / Self Care | Payer: Medicare Other | Source: Ambulatory Visit | Attending: Obstetrics and Gynecology | Admitting: Obstetrics and Gynecology

## 2022-04-23 DIAGNOSIS — Z1231 Encounter for screening mammogram for malignant neoplasm of breast: Secondary | ICD-10-CM

## 2022-05-22 ENCOUNTER — Telehealth: Payer: Self-pay | Admitting: Internal Medicine

## 2022-05-22 ENCOUNTER — Encounter: Payer: Self-pay | Admitting: Nurse Practitioner

## 2022-05-22 ENCOUNTER — Ambulatory Visit: Payer: Medicare Other | Admitting: Nurse Practitioner

## 2022-05-22 ENCOUNTER — Ambulatory Visit (INDEPENDENT_AMBULATORY_CARE_PROVIDER_SITE_OTHER): Payer: Medicare Other

## 2022-05-22 VITALS — BP 100/66 | HR 76 | Ht 64.0 in | Wt 135.6 lb

## 2022-05-22 DIAGNOSIS — J4551 Severe persistent asthma with (acute) exacerbation: Secondary | ICD-10-CM

## 2022-05-22 DIAGNOSIS — J069 Acute upper respiratory infection, unspecified: Secondary | ICD-10-CM | POA: Diagnosis not present

## 2022-05-22 LAB — POC INFLUENZA A&B (BINAX/QUICKVUE)
Influenza A, POC: NEGATIVE
Influenza B, POC: NEGATIVE

## 2022-05-22 LAB — POC COVID19 BINAXNOW: SARS Coronavirus 2 Ag: NEGATIVE

## 2022-05-22 MED ORDER — PREDNISONE 10 MG PO TABS: ORAL_TABLET | ORAL | 0 refills | Status: DC

## 2022-05-22 MED ORDER — FLUTICASONE PROPIONATE 50 MCG/ACT NA SUSP: 2.0000 | Freq: Every day | NASAL | 2 refills | Status: AC

## 2022-05-22 NOTE — Progress Notes (Signed)
$'@Patient'h$  ID: Sandra Carr, female    DOB: 08-29-1956, 66 y.o.   MRN: [id number]  Chief Complaint  Patient presents with   Follow-up    Pt acute visit, she is having wheezing, chest tightness, coughing. No fever or covid exposure (pt reports mother has had a "cold")    Referring provider: No ref. provider found  HPI: 66 year old female, former smoker followed for severe asthma. She is a patient of Dr. Mauricio Po and last seen in office 04/11/2022. Past medical history significant for HTN, allergic rhinitis, GERD, IBS, hyperparathyroidism, anxiety, depression, insomnia, IDA, prediabetes.   TEST/EVENTS:  02/06/2021 eosinophils 800 04/04/2021 PFT: FVC 102, FEV1 86, ratio 71, TLC 95, DLCO corrected 123.  No significant BD in FEV1; did have mid flow reversibility. 02/15/2022 eosinophils 400 04/11/2022 positive RAST; IgE 1043  04/11/2022: OV with Dr. Shearon Stalls. F/u and to establish care. Asthma since childhood. She has needed steroids for her breathing twice since January; before that 2-3 times. Triggers usually seasonal allergies and her dog. Continued high dose Advair. Advsied HEPA filters to help with trigger exposure. Allergen pnael ordered. May need to consider biologic therapy.   05/22/2022: Today - acute Patient presents today for acute visit. Her mom had a cold last week, not sure exactly what it was. She then was at an event over the weekend. Not sure if she was exposed to anyone who was sick but she started having a cough and nasal congestion yesterday. Her cough was terrible last night and kept her awake the whole night. She has also been wheezing, had chest congestion, and slight increase in shortness of breath. She has not had any fevers but she has headaches, sore throat, and clear nasal drainage. She is eating and drinking well. Denies any calf pain, leg swelling or hemoptysis. She continues on Advair twice daily. She used her albuterol this morning. Not taking anything over the counter.    Allergies  Allergen Reactions   Other Hives and Anaphylaxis   Peanut-Containing Drug Products Shortness Of Breath   Contrast Media [Iodinated Contrast Media]    Iohexol      Code: HIVES, Desc: patient claims hive from iv contrast. she drank omnipaque laced water oral contrast before informing anyone of an allergy (no problem from the oral at this time). no record of any enhancable procedures done in our system., Onset Date: [id number]    Latex Swelling   Methocarbamol Swelling   Shellfish Allergy Other (See Comments)    Mouth and Throat Swelling.   Triamcinolone Other (See Comments)   Hydrochlorothiazide Rash    Immunization History  Administered Date(s) Administered   Influenza Split 07/05/2015   Influenza,inj,quad, With Preservative 07/07/2014, 05/11/2019   PFIZER(Purple Top)SARS-COV-2 Vaccination 11/01/2019, 11/22/2019, 07/08/2020   PNEUMOCOCCAL CONJUGATE-20 03/09/2021   Pfizer Covid-19 Vaccine Bivalent Booster 50yr & up 06/14/2021   Pneumococcal Polysaccharide-23 11/14/2002   Td 11/14/2002   Tdap 01/03/2012, 03/20/2022   Zoster, Live 11/23/2015, 05/05/2020, 08/14/2020    Past Medical History:  Diagnosis Date   Asthma     Tobacco History: Social History   Tobacco Use  Smoking Status Former   Packs/day: 0.25   Years: 20.00   Total pack years: 5.00   Types: Cigarettes   Quit date: 1998   Years since quitting: 25.7  Smokeless Tobacco Never   Counseling given: Not Answered   Outpatient Medications Prior to Visit  Medication Sig Dispense Refill   albuterol (PROVENTIL HFA;VENTOLIN HFA) 108 (90 BASE) MCG/ACT inhaler Inhale 2  puffs into the lungs every 6 (six) hours as needed for wheezing or shortness of breath (wheezing).     albuterol (PROVENTIL) (2.5 MG/3ML) 0.083% nebulizer solution Take 2.5 mg by nebulization every 6 (six) hours as needed for wheezing or shortness of breath (wheezing).     amLODipine (NORVASC) 5 MG tablet Take 5 mg by mouth daily.      cyclobenzaprine (FLEXERIL) 10 MG tablet Take 10 mg by mouth 3 (three) times daily.     EPINEPHrine 0.3 mg/0.3 mL IJ SOAJ injection Inject into the muscle as needed.     fluticasone-salmeterol (ADVAIR) 500-50 MCG/ACT AEPB INHALE 1 DOSE     meloxicam (MOBIC) 15 MG tablet Take 1 tablet (15 mg total) by mouth daily. 30 tablet 0   pantoprazole (PROTONIX) 40 MG tablet Take 1 tablet (40 mg total) by mouth daily. 30 tablet 0   sodium chloride (OCEAN) 0.65 % SOLN nasal spray Place 1 spray into both nostrils as needed for congestion.     vitamin B-12 (CYANOCOBALAMIN) 1000 MCG tablet Take 1 tablet (1,000 mcg total) by mouth daily. 30 tablet 0   zolpidem (AMBIEN) 10 MG tablet Take 10 mg by mouth at bedtime as needed for sleep.     fluticasone-salmeterol (ADVAIR) 500-50 MCG/ACT AEPB Inhale 1 puff into the lungs in the morning and at bedtime. 60 each 0   Potassium Chloride ER 20 MEQ TBCR Take 1 tablet by mouth daily. (Patient not taking: Reported on 05/22/2022)     methylPREDNISolone (MEDROL DOSEPAK) 4 MG TBPK tablet Take as prescribed 1 each 0   No facility-administered medications prior to visit.     Review of Systems:   Constitutional: No weight loss or gain, night sweats, fevers, chills, or lassitude. +fatigue HEENT: No difficulty swallowing, tooth/dental problems. No sneezing, itching, ear ache. +headaches, sore throat nasal congestion/drainage CV:  No chest pain, orthopnea, PND, swelling in lower extremities, anasarca, dizziness, palpitations, syncope Resp: +shortness of breath with exertion; congested cough. No excess mucus or change in color of mucus. No hemoptysis. No wheezing.  No chest wall deformity GI:  No heartburn, indigestion, abdominal pain, nausea, vomiting, diarrhea, change in bowel habits, loss of appetite, bloody stools.  Skin: No rash, lesions, ulcerations MSK:  No joint pain or swelling.  No decreased range of motion.  No back pain. Neuro: No dizziness or lightheadedness.  Psych: No  depression or anxiety. Mood stable.     Physical Exam:  BP 100/66   Pulse 76   Ht '5\' 4"'$  (1.626 m)   Wt 135 lb 9.6 oz (61.5 kg)   LMP 05/18/1993   SpO2 99%   BMI 23.28 kg/m   GEN: Pleasant, interactive, well-appearing; in no acute distress. HEENT:  Normocephalic and atraumatic. EACs patent bilaterally. TM pearly gray with present light reflex bilaterally. PERRLA. Sclera white. Nasal turbinates erythematous, moist and patent bilaterally. clear rhinorrhea present. Oropharynx erythematous and moist, without exudate or edema. No lesions, ulcerations. Sinus tenderness NECK:  Supple w/ fair ROM. No JVD present. Normal carotid impulses w/o bruits. Thyroid symmetrical with no goiter or nodules palpated. Cervical LAD CV: RRR, no m/r/g, no peripheral edema. Pulses intact, +2 bilaterally. No cyanosis, pallor or clubbing. PULMONARY:  Unlabored, regular breathing. Minimal end expiratory wheezes bilaterally A&P. Congested cough. No accessory muscle use. No dullness to percussion. GI: BS present and normoactive. Soft, non-tender to palpation. No organomegaly or masses detected.  MSK: No erythema, warmth or tenderness. No deformities or joint swelling noted.  Neuro: A/Ox3. No focal  deficits noted.   Skin: Warm, no lesions or rashe Psych: Normal affect and behavior. Judgement and thought content appropriate.     Lab Results:  CBC    Component Value Date/Time   WBC 9.8 02/15/2022 2136   RBC 5.49 (H) 02/15/2022 2136   HGB 12.4 02/15/2022 2136   HCT 37.6 02/15/2022 2136   PLT 373 02/15/2022 2136   MCV 68.5 (L) 02/15/2022 2136   MCH 22.6 (L) 02/15/2022 2136   MCHC 33.0 02/15/2022 2136   RDW 17.3 (H) 02/15/2022 2136   LYMPHSABS 2.9 02/15/2022 2136   MONOABS 0.6 02/15/2022 2136   EOSABS 0.4 02/15/2022 2136   BASOSABS 0.1 02/15/2022 2136    BMET    Component Value Date/Time   NA 142 02/15/2022 2136   K 3.4 (L) 02/15/2022 2136   CL 109 02/15/2022 2136   CO2 26 02/15/2022 2136   GLUCOSE  103 (H) 02/15/2022 2136   BUN 12 02/15/2022 2136   CREATININE 0.87 02/15/2022 2136   CALCIUM 9.4 02/15/2022 2136   GFRNONAA >60 02/15/2022 2136   GFRAA >60 09/20/2019 0408    BNP    Component Value Date/Time   BNP 5.8 02/15/2022 2136     Imaging:  DG Chest 2 View  Result Date: 05/22/2022 CLINICAL DATA:  Productive cough. EXAM: CHEST - 2 VIEW COMPARISON:  February 15, 2022. FINDINGS: The heart size and mediastinal contours are within normal limits. Both lungs are clear. The visualized skeletal structures are unremarkable. IMPRESSION: No active cardiopulmonary disease. Electronically Signed   By: Marijo Conception M.D.   On: 05/22/2022 12:31   MM 3D SCREEN BREAST BILATERAL  Result Date: 04/24/2022 CLINICAL DATA:  Screening. EXAM: DIGITAL SCREENING BILATERAL MAMMOGRAM WITH TOMOSYNTHESIS AND CAD TECHNIQUE: Bilateral screening digital craniocaudal and mediolateral oblique mammograms were obtained. Bilateral screening digital breast tomosynthesis was performed. The images were evaluated with computer-aided detection. COMPARISON:  Previous exam(s). ACR Breast Density Category b: There are scattered areas of fibroglandular density. FINDINGS: There are no findings suspicious for malignancy. IMPRESSION: No mammographic evidence of malignancy. A result letter of this screening mammogram will be mailed directly to the patient. RECOMMENDATION: Screening mammogram in one year. (Code:SM-B-01Y) BI-RADS CATEGORY  1: Negative. Electronically Signed   By: Lajean Manes M.D.   On: 04/24/2022 15:37         Latest Ref Rng & Units 04/04/2021   10:57 AM  PFT Results  FVC-Pre L 2.55   FVC-Predicted Pre % 102   FVC-Post L 2.55   FVC-Predicted Post % 102   Pre FEV1/FVC % % 66   Post FEV1/FCV % % 71   FEV1-Pre L 1.67   FEV1-Predicted Pre % 86   FEV1-Post L 1.80   DLCO uncorrected ml/min/mmHg 22.01   DLCO UNC% % 111   DLCO corrected ml/min/mmHg 24.40   DLCO COR %Predicted % 123   DLVA Predicted % 149   TLC L  4.83   TLC % Predicted % 95   RV % Predicted % 101     No results found for: "NITRICOXIDE"      Assessment & Plan:   URI (upper respiratory infection) Viral URI with recent sick exposure. COVID/flu negative today. Advised on supportive care measures. We will check cxr to rule out superimposed infection; otherwise, no indication for abx.   Patient Instructions  Continue Advair 2 puffs Twice daily. Brush tongue and rinse mouth afterwards Continue Albuterol inhaler 2 puffs or 3 mL neb every 6 hours as needed for  shortness of breath or wheezing. Notify if symptoms persist despite rescue inhaler/neb use. Use your nebulizer at least twice a day until symptoms improve  Continue saline nasal spray 2-3 times a day for nasal congestion/drainage  Prednisone taper. 4 tabs for 2 days, then 3 tabs for 2 days, 2 tabs for 2 days, then 1 tab for 2 days, then stop. Take in AM with food Mucinex 600 mg Twice daily for congestion Flonase nasal spray 2 sprays each nostril daily for nasal congestion/drainage Tylenol 650 mg every 6 hours as needed for headaches. Do not exceed 4000 mg in 24 hours Throat lozenges for sore throat Can use delsym 2 tsp Twice daily as needed for cough   Chest x ray today  Follow up in 2 weeks with Dr. Shearon Stalls (1st) or Katie Brandon Wiechman,NP. If symptoms do not improve or worsen, please contact office for sooner follow up or seek emergency care.     Asthma exacerbation Acute exacerbation related to URI. Non-toxic with stable VS. O2 99% on room air. Prednisone taper. Continue high dose ICS/LABA. Advised to use neb treatments at least twice daily until symptoms improve. Close follow up.   I spent 35 minutes of dedicated to the care of this patient on the date of this encounter to include pre-visit review of records, face-to-face time with the patient discussing conditions above, post visit ordering of testing, clinical documentation with the electronic health record, making appropriate  referrals as documented, and communicating necessary findings to members of the patients care team.  Clayton Bibles, NP 05/22/2022  Pt aware and understands NP's role.

## 2022-05-22 NOTE — Telephone Encounter (Signed)
Had a cancellation at 11am with [facility], scheduled patient. Nothing further needed.

## 2022-05-22 NOTE — Assessment & Plan Note (Signed)
Viral URI with recent sick exposure. COVID/flu negative today. Advised on supportive care measures. We will check cxr to rule out superimposed infection; otherwise, no indication for abx.   Patient Instructions  Continue Advair 2 puffs Twice daily. Brush tongue and rinse mouth afterwards Continue Albuterol inhaler 2 puffs or 3 mL neb every 6 hours as needed for shortness of breath or wheezing. Notify if symptoms persist despite rescue inhaler/neb use. Use your nebulizer at least twice a day until symptoms improve  Continue saline nasal spray 2-3 times a day for nasal congestion/drainage  Prednisone taper. 4 tabs for 2 days, then 3 tabs for 2 days, 2 tabs for 2 days, then 1 tab for 2 days, then stop. Take in AM with food Mucinex 600 mg Twice daily for congestion Flonase nasal spray 2 sprays each nostril daily for nasal congestion/drainage Tylenol 650 mg every 6 hours as needed for headaches. Do not exceed 4000 mg in 24 hours Throat lozenges for sore throat Can use delsym 2 tsp Twice daily as needed for cough   Chest x ray today  Follow up in 2 weeks with Dr. Shearon Stalls (1st) or Katie Lavra Imler,NP. If symptoms do not improve or worsen, please contact office for sooner follow up or seek emergency care.

## 2022-05-22 NOTE — Patient Instructions (Addendum)
Continue Advair 2 puffs Twice daily. Brush tongue and rinse mouth afterwards Continue Albuterol inhaler 2 puffs or 3 mL neb every 6 hours as needed for shortness of breath or wheezing. Notify if symptoms persist despite rescue inhaler/neb use. Use your nebulizer at least twice a day until symptoms improve  Continue saline nasal spray 2-3 times a day for nasal congestion/drainage  Prednisone taper. 4 tabs for 2 days, then 3 tabs for 2 days, 2 tabs for 2 days, then 1 tab for 2 days, then stop. Take in AM with food Mucinex 600 mg Twice daily for congestion Flonase nasal spray 2 sprays each nostril daily for nasal congestion/drainage Tylenol 650 mg every 6 hours as needed for headaches. Do not exceed 4000 mg in 24 hours Throat lozenges for sore throat Can use delsym 2 tsp Twice daily as needed for cough   Chest x ray today  Follow up in 2 weeks with Dr. Shearon Stalls (1st) or Katie Caris Cerveny,NP. If symptoms do not improve or worsen, please contact office for sooner follow up or seek emergency care.

## 2022-05-22 NOTE — Assessment & Plan Note (Addendum)
Acute exacerbation related to URI. Non-toxic with stable VS. O2 99% on room air. Prednisone taper. Continue high dose ICS/LABA. Advised to use neb treatments at least twice daily until symptoms improve. Close follow up.

## 2022-05-22 NOTE — Telephone Encounter (Signed)
Patient called requesting appointment due to asthma flare, chest tightness,and wheezing. Which started yesterday. No openings today, please advise. Uses [facility] on [address].  Please advise- call back number is [phone].

## 2022-05-24 ENCOUNTER — Encounter: Payer: Self-pay | Admitting: Nurse Practitioner

## 2022-06-11 ENCOUNTER — Ambulatory Visit: Payer: Medicare Other | Admitting: Nurse Practitioner

## 2022-06-11 ENCOUNTER — Encounter: Payer: Self-pay | Admitting: Nurse Practitioner

## 2022-06-11 ENCOUNTER — Ambulatory Visit (INDEPENDENT_AMBULATORY_CARE_PROVIDER_SITE_OTHER): Payer: Medicare Other

## 2022-06-11 VITALS — BP 128/68 | HR 76 | Temp 98.2°F | Ht 64.0 in | Wt 139.2 lb

## 2022-06-11 DIAGNOSIS — J069 Acute upper respiratory infection, unspecified: Secondary | ICD-10-CM | POA: Diagnosis not present

## 2022-06-11 DIAGNOSIS — J309 Allergic rhinitis, unspecified: Secondary | ICD-10-CM

## 2022-06-11 DIAGNOSIS — J4551 Severe persistent asthma with (acute) exacerbation: Secondary | ICD-10-CM

## 2022-06-11 LAB — POCT EXHALED NITRIC OXIDE: FeNO level (ppb): 11

## 2022-06-11 LAB — POC COVID19 BINAXNOW: SARS Coronavirus 2 Ag: NEGATIVE

## 2022-06-11 MED ORDER — BENZONATATE 200 MG PO CAPS: 200.0000 mg | ORAL_CAPSULE | Freq: Three times a day (TID) | ORAL | 1 refills | Status: AC | PRN

## 2022-06-11 MED ORDER — PREDNISONE 10 MG PO TABS: ORAL_TABLET | ORAL | 0 refills | Status: AC

## 2022-06-11 NOTE — Patient Instructions (Addendum)
Continue Advair 2 puffs Twice daily. Brush tongue and rinse mouth afterwards Continue Albuterol inhaler 2 puffs or 3 mL neb every 6 hours as needed for shortness of breath or wheezing. Notify if symptoms persist despite rescue inhaler/neb use. Use your nebulizer at least twice a day until symptoms improve  Continue saline nasal spray 2-3 times a day for nasal congestion/drainage   Prednisone taper. 4 tabs for 2 days, then 3 tabs for 2 days, 2 tabs for 2 days, then 1 tab for 2 days, then stop. Take in AM with food Mucinex 600 mg Twice daily for congestion Flonase nasal spray 2 sprays each nostril daily for nasal congestion/drainage Delsym 2 tsp Twice daily for cough Tessalon perles (benzonatate) 1 capsule Three times a day for cough   Chest x ray today   Follow up in 2 weeks with Dr. Shearon Stalls (1st) or Katie Tawania Daponte,NP. If symptoms do not improve or worsen, please contact office for sooner follow up or seek emergency care.

## 2022-06-11 NOTE — Assessment & Plan Note (Addendum)
New onset symptoms 10/2 with congested, productive cough.  She has a known COVID exposure.  Her COVID test today was negative.  We will hold off on antiviral therapy at this point.  Advised supportive care measures.  Strict return/ED precautions should her breathing worsen.  We will check CXR today to rule out superimposed infection.  Patient Instructions  Continue Advair 2 puffs Twice daily. Brush tongue and rinse mouth afterwards Continue Albuterol inhaler 2 puffs or 3 mL neb every 6 hours as needed for shortness of breath or wheezing. Notify if symptoms persist despite rescue inhaler/neb use. Use your nebulizer at least twice a day until symptoms improve  Continue saline nasal spray 2-3 times a day for nasal congestion/drainage   Prednisone taper. 4 tabs for 2 days, then 3 tabs for 2 days, 2 tabs for 2 days, then 1 tab for 2 days, then stop. Take in AM with food Mucinex 600 mg Twice daily for congestion Flonase nasal spray 2 sprays each nostril daily for nasal congestion/drainage Delsym 2 tsp Twice daily for cough Tessalon perles (benzonatate) 1 capsule Three times a day for cough   Chest x ray today   Follow up in 2 weeks with Dr. Shearon Stalls (1st) or Katie King Pinzon,NP. If symptoms do not improve or worsen, please contact office for sooner follow up or seek emergency care.

## 2022-06-11 NOTE — Progress Notes (Signed)
$'@Patient'y$  ID: Sandra Carr, female    DOB: 22-Sep-1955, 66 y.o.   MRN: [id number]  Chief Complaint  Patient presents with   Follow-up    Pt is here for follow up. Pt states that since last visit she has still felt bad. Pt states she went to PCP a few days before cruise and got a depo shot and she returned last satursday still feeling bad. Pt states she is still coughing with mucus production. Clear-yellow mucus. Daughter tested positive for covid. No fever noted. Chest and back hurting since Sunday. Pt states the Advair is not helping much. Pt is taking otc cough drops for her cough.     Referring provider: No ref. provider found  HPI: 66 year old female, former smoker followed for severe asthma. She is a patient of Dr. Mauricio Po and last seen in office 05/22/2022 by Belenda Cruise NP. Past medical history significant for HTN, allergic rhinitis, GERD, IBS, hyperparathyroidism, anxiety, depression, insomnia, IDA, prediabetes.   TEST/EVENTS:  02/06/2021 eosinophils 800 04/04/2021 PFT: FVC 102, FEV1 86, ratio 71, TLC 95, DLCO corrected 123.  No significant BD in FEV1; did have mid flow reversibility. 02/15/2022 eosinophils 400 04/11/2022 positive RAST; IgE 1043  04/11/2022: OV with Dr. Shearon Stalls. F/u and to establish care. Asthma since childhood. She has needed steroids for her breathing twice since January; before that 2-3 times. Triggers usually seasonal allergies and her dog. Continued high dose Advair. Advsied HEPA filters to help with trigger exposure. Allergen pnael ordered. May need to consider biologic therapy.   05/22/2022: OV with Fread Kottke NP for acute visit. Her mom had a cold last week, not sure exactly what it was. She then was at an event over the weekend. Not sure if she was exposed to anyone who was sick but she started having a cough and nasal congestion yesterday. Her cough was terrible last night and kept her awake the whole night. She has also been wheezing, had chest congestion, and slight increase in  shortness of breath. Treated for asthma exacerbation and viral URI with recent sick exposure. COVID/flu negative today. Advised on supportive care measures. We will check cxr to rule out superimposed infection; otherwise, no indication for abx. Prednisone taper.  06/11/2022: Today - follow up Patient presents today for intended follow up but is currently having acute symptoms. After she was seen last, she went to her PCP and had a depo injection. She was feeling better and went on a cruise with her daughter for 7 days. She got back Saturday and started feeling poorly on Sunday. Her cough worsened and is now productive with clear to yellow sputum. She also had some discomfort in her chest and back that's been persistent since Sunday; feels very congested. She gets short winded with activity and coughing spells. She also has some nasal congestion and postnasal drip. She denies any fevers, chills, hemoptysis. Her daughter that she was with tested positive for COVID today last night. She is using her Advair twice daily. Using albuterol a few times a day.   Allergies  Allergen Reactions   Other Hives and Anaphylaxis   Peanut-Containing Drug Products Shortness Of Breath   Contrast Media [Iodinated Contrast Media]    Iohexol      Code: HIVES, Desc: patient claims hive from iv contrast. she drank omnipaque laced water oral contrast before informing anyone of an allergy (no problem from the oral at this time). no record of any enhancable procedures done in our system., Onset Date: 14970263  Latex Swelling   Methocarbamol Swelling   Shellfish Allergy Other (See Comments)    Mouth and Throat Swelling.   Triamcinolone Other (See Comments)   Hydrochlorothiazide Rash    Immunization History  Administered Date(s) Administered   Influenza Split 07/05/2015   Influenza,inj,quad, With Preservative 07/07/2014, 05/11/2019   PFIZER(Purple Top)SARS-COV-2 Vaccination 11/01/2019, 11/22/2019, 07/08/2020    PNEUMOCOCCAL CONJUGATE-20 03/09/2021   Pfizer Covid-19 Vaccine Bivalent Booster 33yr & up 06/14/2021   Pneumococcal Polysaccharide-23 11/14/2002   Td 11/14/2002   Tdap 01/03/2012, 03/20/2022   Zoster, Live 11/23/2015, 05/05/2020, 08/14/2020    Past Medical History:  Diagnosis Date   Asthma     Tobacco History: Social History   Tobacco Use  Smoking Status Former   Packs/day: 0.25   Years: 20.00   Total pack years: 5.00   Types: Cigarettes   Quit date: 1998   Years since quitting: 25.7  Smokeless Tobacco Never   Counseling given: Not Answered   Outpatient Medications Prior to Visit  Medication Sig Dispense Refill   albuterol (PROVENTIL HFA;VENTOLIN HFA) 108 (90 BASE) MCG/ACT inhaler Inhale 2 puffs into the lungs every 6 (six) hours as needed for wheezing or shortness of breath (wheezing).     albuterol (PROVENTIL) (2.5 MG/3ML) 0.083% nebulizer solution Take 2.5 mg by nebulization every 6 (six) hours as needed for wheezing or shortness of breath (wheezing).     amLODipine (NORVASC) 5 MG tablet Take 5 mg by mouth daily.     cyclobenzaprine (FLEXERIL) 10 MG tablet Take 10 mg by mouth 3 (three) times daily.     EPINEPHrine 0.3 mg/0.3 mL IJ SOAJ injection Inject into the muscle as needed.     fluticasone (FLONASE) 50 MCG/ACT nasal spray Place 2 sprays into both nostrils daily. 18.2 mL 2   fluticasone-salmeterol (ADVAIR) 500-50 MCG/ACT AEPB INHALE 1 DOSE     meloxicam (MOBIC) 15 MG tablet Take 1 tablet (15 mg total) by mouth daily. 30 tablet 0   pantoprazole (PROTONIX) 40 MG tablet Take 1 tablet (40 mg total) by mouth daily. 30 tablet 0   Potassium Chloride ER 20 MEQ TBCR Take 1 tablet by mouth daily.     sodium chloride (OCEAN) 0.65 % SOLN nasal spray Place 1 spray into both nostrils as needed for congestion.     vitamin B-12 (CYANOCOBALAMIN) 1000 MCG tablet Take 1 tablet (1,000 mcg total) by mouth daily. 30 tablet 0   zolpidem (AMBIEN) 10 MG tablet Take 10 mg by mouth at bedtime  as needed for sleep.     predniSONE (DELTASONE) 10 MG tablet 4 tabs for 2 days, then 3 tabs for 2 days, 2 tabs for 2 days, then 1 tab for 2 days, then stop 20 tablet 0   fluticasone-salmeterol (ADVAIR) 500-50 MCG/ACT AEPB Inhale 1 puff into the lungs in the morning and at bedtime. 60 each 0   No facility-administered medications prior to visit.     Review of Systems:   Constitutional: No weight loss or gain, night sweats, fevers, chills, or lassitude. +fatigue HEENT: No difficulty swallowing, tooth/dental problems. No sneezing, itching, ear ache, headaches, sore throat. +nasal congestion/drainage CV:  No chest pain, orthopnea, PND, swelling in lower extremities, anasarca, dizziness, palpitations, syncope Resp: +shortness of breath with exertion; productive cough; wheezing; chest congestion. No hemoptysis. No chest wall deformity GI:  No heartburn, indigestion, abdominal pain, nausea, vomiting, diarrhea, change in bowel habits, loss of appetite, bloody stools.  Skin: No rash, lesions, ulcerations MSK:  No joint pain or swelling.  No decreased range of motion.  No back pain. Neuro: No dizziness or lightheadedness.  Psych: No depression or anxiety. Mood stable.     Physical Exam:  BP 128/68 (BP Location: Right Arm, Patient Position: Sitting, Cuff Size: Normal)   Pulse 76   Temp 98.2 F (36.8 C) (Oral)   Ht '5\' 4"'$  (1.626 m)   Wt 139 lb 3.2 oz (63.1 kg)   LMP 05/18/1993   SpO2 96%   BMI 23.89 kg/m   GEN: Pleasant, interactive, well-appearing; in no acute distress. HEENT:  Normocephalic and atraumatic. EACs patent bilaterally. TM pearly gray with present light reflex bilaterally. PERRLA. Sclera white. Nasal turbinates erythematous, moist and patent bilaterally. Clear rhinorrhea present. Oropharynx erythematous and moist, without exudate or edema. No lesions, ulcerations.  NECK:  Supple w/ fair ROM. No JVD present. Normal carotid impulses w/o bruits. Thyroid symmetrical with no goiter or  nodules palpated.  CV: RRR, no m/r/g, no peripheral edema. Pulses intact, +2 bilaterally. No cyanosis, pallor or clubbing. PULMONARY:  Unlabored, regular breathing. Clear bilaterally A&P w/o wheezes/rales/rhonchi. Congested cough. No accessory muscle use. No dullness to percussion. GI: BS present and normoactive. Soft, non-tender to palpation. No organomegaly or masses detected.  MSK: No erythema, warmth or tenderness. No deformities or joint swelling noted.  Neuro: A/Ox3. No focal deficits noted.   Skin: Warm, no lesions or rashe Psych: Normal affect and behavior. Judgement and thought content appropriate.     Lab Results:  CBC    Component Value Date/Time   WBC 9.8 02/15/2022 2136   RBC 5.49 (H) 02/15/2022 2136   HGB 12.4 02/15/2022 2136   HCT 37.6 02/15/2022 2136   PLT 373 02/15/2022 2136   MCV 68.5 (L) 02/15/2022 2136   MCH 22.6 (L) 02/15/2022 2136   MCHC 33.0 02/15/2022 2136   RDW 17.3 (H) 02/15/2022 2136   LYMPHSABS 2.9 02/15/2022 2136   MONOABS 0.6 02/15/2022 2136   EOSABS 0.4 02/15/2022 2136   BASOSABS 0.1 02/15/2022 2136    BMET    Component Value Date/Time   NA 142 02/15/2022 2136   K 3.4 (L) 02/15/2022 2136   CL 109 02/15/2022 2136   CO2 26 02/15/2022 2136   GLUCOSE 103 (H) 02/15/2022 2136   BUN 12 02/15/2022 2136   CREATININE 0.87 02/15/2022 2136   CALCIUM 9.4 02/15/2022 2136   GFRNONAA >60 02/15/2022 2136   GFRAA >60 09/20/2019 0408    BNP    Component Value Date/Time   BNP 5.8 02/15/2022 2136     Imaging:  DG Chest 2 View  Result Date: 05/22/2022 CLINICAL DATA:  Productive cough. EXAM: CHEST - 2 VIEW COMPARISON:  February 15, 2022. FINDINGS: The heart size and mediastinal contours are within normal limits. Both lungs are clear. The visualized skeletal structures are unremarkable. IMPRESSION: No active cardiopulmonary disease. Electronically Signed   By: Marijo Conception M.D.   On: 05/22/2022 12:31         Latest Ref Rng & Units 04/04/2021   10:57  AM  PFT Results  FVC-Pre L 2.55   FVC-Predicted Pre % 102   FVC-Post L 2.55   FVC-Predicted Post % 102   Pre FEV1/FVC % % 66   Post FEV1/FCV % % 71   FEV1-Pre L 1.67   FEV1-Predicted Pre % 86   FEV1-Post L 1.80   DLCO uncorrected ml/min/mmHg 22.01   DLCO UNC% % 111   DLCO corrected ml/min/mmHg 24.40   DLCO COR %Predicted % 123   DLVA  Predicted % 149   TLC L 4.83   TLC % Predicted % 95   RV % Predicted % 101     No results found for: "NITRICOXIDE"      Assessment & Plan:   URI (upper respiratory infection) New onset symptoms 10/2 with congested, productive cough.  She has a known COVID exposure.  Her COVID test today was negative.  We will hold off on antiviral therapy at this point.  Advised supportive care measures.  Strict return/ED precautions should her breathing worsen.  We will check CXR today to rule out superimposed infection.  Patient Instructions  Continue Advair 2 puffs Twice daily. Brush tongue and rinse mouth afterwards Continue Albuterol inhaler 2 puffs or 3 mL neb every 6 hours as needed for shortness of breath or wheezing. Notify if symptoms persist despite rescue inhaler/neb use. Use your nebulizer at least twice a day until symptoms improve  Continue saline nasal spray 2-3 times a day for nasal congestion/drainage   Prednisone taper. 4 tabs for 2 days, then 3 tabs for 2 days, 2 tabs for 2 days, then 1 tab for 2 days, then stop. Take in AM with food Mucinex 600 mg Twice daily for congestion Flonase nasal spray 2 sprays each nostril daily for nasal congestion/drainage Delsym 2 tsp Twice daily for cough Tessalon perles (benzonatate) 1 capsule Three times a day for cough   Chest x ray today   Follow up in 2 weeks with Dr. Shearon Stalls (1st) or Katie Jasmia Angst,NP. If symptoms do not improve or worsen, please contact office for sooner follow up or seek emergency care.    Asthma exacerbation Mild acute flare related to URI. We will treat her with prednisone taper.  Continue maintenance inhaler and PRN albuterol. See above plan.   I spent 35 minutes of dedicated to the care of this patient on the date of this encounter to include pre-visit review of records, face-to-face time with the patient discussing conditions above, post visit ordering of testing, clinical documentation with the electronic health record, making appropriate referrals as documented, and communicating necessary findings to members of the patients care team.  Clayton Bibles, NP 06/11/2022  Pt aware and understands NP's role.

## 2022-06-11 NOTE — Assessment & Plan Note (Signed)
Mild acute flare related to URI. We will treat her with prednisone taper. Continue maintenance inhaler and PRN albuterol. See above plan.

## 2022-06-13 MED ORDER — MONTELUKAST SODIUM 10 MG PO TABS: 10.0000 mg | ORAL_TABLET | Freq: Every day | ORAL | 5 refills | Status: DC

## 2022-06-13 NOTE — Assessment & Plan Note (Signed)
URI symptoms. Recent exposure to COVID; although, COVID test today was negative. See above. We are starting her on singulair at bedtime. She is currently on Zyrtec 10 mg daily for allergies.

## 2022-06-13 NOTE — Addendum Note (Signed)
Addended by: Clayton Bibles on: 06/13/2022 02:02 PM   Modules accepted: Orders

## 2022-06-27 ENCOUNTER — Ambulatory Visit: Payer: Medicare Other | Admitting: Internal Medicine

## 2022-06-27 ENCOUNTER — Encounter: Payer: Self-pay | Admitting: Internal Medicine

## 2022-06-27 VITALS — BP 120/60 | HR 66 | Ht 64.5 in | Wt 140.6 lb

## 2022-06-27 DIAGNOSIS — D7219 Other eosinophilia: Secondary | ICD-10-CM

## 2022-06-27 DIAGNOSIS — J301 Allergic rhinitis due to pollen: Secondary | ICD-10-CM

## 2022-06-27 DIAGNOSIS — J4550 Severe persistent asthma, uncomplicated: Secondary | ICD-10-CM

## 2022-06-27 DIAGNOSIS — R768 Other specified abnormal immunological findings in serum: Secondary | ICD-10-CM | POA: Diagnosis not present

## 2022-06-27 NOTE — Progress Notes (Signed)
Sandra Carr    [id number]    1955-12-01  Primary Care Physician:Wong, Edwyna Shell, MD (Inactive) Date of Appointment: 06/27/2022 Established Patient Visit  Chief complaint:   Chief Complaint  Patient presents with   Follow-up     HPI: Sandra Carr is a 66 y.o. with history of severe persistent asthma, not well controlled  Interval Updates: Here for follow up after two sick visits for asthma both requiring prednisone tapers and also saw her PCP and had Depo injection.   Had covid exposure while traveling on a cruuise.  Was also started on singulair.   Feeling much better after her repeated prednisone courses.   Says her smptoms really worsened when her mom's dog moved in with her.  But also says she was having flares induced by lysol that had landed her in the hospital.   Current Regimen: advair 500 1 puff twice a day. Uses albuterol inhaler less than once/week.  Asthma Triggers: seasonal allergies, chemical smells, cleaning smells.  Exacerbations in the last year: 9 in the last year History of hospitalization or intubation: hospitalized in June 2022, has had multiple hosptalizations previously.  Allergy Testing: previously was on SCIT in the 1980s GERD: denies Allergic Rhinitis: prn anti-histamine, singulair.  ACT:  Asthma Control Test ACT Total Score  06/27/2022 11:18 AM 19  05/22/2022 11:11 AM 21   FeNO:  Lives at home with mother and dog.  She is allergic to dogs and lives with a malti-poo (it's her mom's dog.)  I have reviewed the patient's family social and past medical history and updated as appropriate.   Past Medical History:  Diagnosis Date   Asthma     Past Surgical History:  Procedure Laterality Date   ABDOMINAL HYSTERECTOMY     FOOT SURGERY Left     Family History  Problem Relation Age of Onset   Breast cancer Cousin    Hypertension Mother    Diabetes Mother    Hypertension Father    Heart failure Brother    Heart  disease Brother     Social History   Occupational History   Not on file  Tobacco Use   Smoking status: Former    Packs/day: 0.25    Years: 20.00    Total pack years: 5.00    Types: Cigarettes    Quit date: 1998    Years since quitting: 25.8   Smokeless tobacco: Never  Vaping Use   Vaping Use: Never used  Substance and Sexual Activity   Alcohol use: Yes    Comment: occ wine   Drug use: Never   Sexual activity: Not on file     Physical Exam: Blood pressure 120/60, pulse 66, height 5' 4.5" (1.638 m), weight 140 lb 9.6 oz (63.8 kg), last menstrual period 05/18/1993, SpO2 100 %.  Gen:      No acute distress Lungs:    ctab no wheezes or crackles CV:         RRR no mrg   Data Reviewed: Imaging: I have personally reviewed the Chest xray June 2023 - no acute process  PFTs:     Latest Ref Rng & Units 04/04/2021   10:57 AM  PFT Results  FVC-Pre L 2.55   FVC-Predicted Pre % 102   FVC-Post L 2.55   FVC-Predicted Post % 102   Pre FEV1/FVC % % 66   Post FEV1/FCV % % 71   FEV1-Pre L 1.67  FEV1-Predicted Pre % 86   FEV1-Post L 1.80   DLCO uncorrected ml/min/mmHg 22.01   DLCO UNC% % 111   DLCO corrected ml/min/mmHg 24.40   DLCO COR %Predicted % 123   DLVA Predicted % 149   TLC L 4.83   TLC % Predicted % 95   RV % Predicted % 101    I have personally reviewed the patient's PFTs and mild airflow limitation no significant bronchodilator response.   Labs:  Immunization status: Immunization History  Administered Date(s) Administered   Influenza Split 07/05/2015   Influenza,inj,quad, With Preservative 07/07/2014, 05/11/2019   PFIZER(Purple Top)SARS-COV-2 Vaccination 11/01/2019, 11/22/2019, 07/08/2020   PNEUMOCOCCAL CONJUGATE-20 03/09/2021   Pfizer Covid-19 Vaccine Bivalent Booster 49yr & up 06/14/2021   Pneumococcal Polysaccharide-23 11/14/2002   Td 11/14/2002   Tdap 01/03/2012, 03/20/2022   Zoster, Live 11/23/2015, 05/05/2020, 08/14/2020    External Records  Personally Reviewed: pulmonary  Assessment:  Severe persistent asthma, poorly controlled with >8 prednisone courses in the last year Elevated IgE Peripheral eosinophilia AE 800 in May 2022.  Allergic rhinitis Food allergy anaphylaxis   Plan/Recommendations: continue advair 500 1 puff twice a day continue albuterol as needed. Start taking daily scheduled with claritin or cetirizine.  Her IgE is off the charts high. She has extensive aeroallergen sensitivity. Her main risk for repeat exacerbation is ongoing inflammatory cascade with uncontrolled trigger exposure.   She is really hesitant on discussing biologic therapy.  When I ask her why she is hesitant on biologic therapy she says she prefers "naturopathic" remedies and tries to avoid medication.  I offered referral to allergy for second opinion and consideration of allergy shots and she has had anaphylaxis before with SCIT and declined at this time.  Right now she wants to talk to her mom about getting rid of the dog and see if this helps.   High likelihood for repeat exacerbation based on current treatment plan.    Return to Care: Return in about 3 months (around 09/27/2022).   NLenice Llamas MD [facility]

## 2022-06-27 NOTE — Patient Instructions (Addendum)
Please schedule follow up scheduled with APP in 3 months.  If my schedule is not open yet, we will contact you with a reminder closer to that time. Please call [phone] if you haven't heard from [place] a month before.   Continue advair 1 puff twice a day Continue singulair, continue daily anti-histamine with cetirizine or similar over the counter.  Agree with getting rid of the dog as you are very sensitive to dog but multiple other triggers.  I recommend you start biologic therapy at this point based on how often you're needing steroids for your asthma.  Options include Tespire, Dupixent. You can read more about these online.

## 2022-08-27 ENCOUNTER — Other Ambulatory Visit: Payer: Self-pay | Admitting: Family Medicine

## 2022-08-27 ENCOUNTER — Ambulatory Visit
Admission: RE | Admit: 2022-08-27 | Discharge: 2022-08-27 | Disposition: A | Discharge status: Home / Self Care | Payer: Medicare Other | Source: Ambulatory Visit | Attending: Family Medicine | Admitting: Family Medicine

## 2022-08-27 DIAGNOSIS — R0989 Other specified symptoms and signs involving the circulatory and respiratory systems: Secondary | ICD-10-CM

## 2022-09-10 ENCOUNTER — Ambulatory Visit
Admission: RE | Admit: 2022-09-10 | Discharge: 2022-09-10 | Disposition: A | Discharge status: Home / Self Care | Payer: Medicare Other | Source: Ambulatory Visit | Attending: Family Medicine | Admitting: Family Medicine

## 2022-09-10 ENCOUNTER — Other Ambulatory Visit: Payer: Self-pay | Admitting: Family Medicine

## 2022-09-10 DIAGNOSIS — M25551 Pain in right hip: Secondary | ICD-10-CM

## 2022-10-07 ENCOUNTER — Ambulatory Visit: Payer: Medicare Other | Admitting: Podiatry

## 2022-10-07 ENCOUNTER — Encounter: Payer: Self-pay | Admitting: Podiatry

## 2022-10-07 VITALS — BP 139/90 | HR 79

## 2022-10-07 DIAGNOSIS — L600 Ingrowing nail: Secondary | ICD-10-CM

## 2022-10-07 NOTE — Progress Notes (Signed)
  Subjective:  Patient ID: Sandra Carr, female    DOB: August 21, 1956,   MRN: [id number]  Chief Complaint  Patient presents with   Ingrown Toenail    Bilateral ingrown hallux nail, right  hurts worse than the left,     67 y.o. female presents for concern of bilateral ingrown toenails that have been bothersome for her for quite a while particularly the right great toenail. Patient relates she often has to pick at the medial border to get it to feel better. Does relates some burning type pain down the bottom of her foot and into her toe and does have a history of lower back pain.  . Denies any other pedal complaints. Denies n/v/f/c.   Past Medical History:  Diagnosis Date   Asthma     Objective:  Physical Exam: Vascular: DP/PT pulses 2/4 bilateral. CFT <3 seconds. Normal hair growth on digits. No edema.  Skin. No lacerations or abrasions bilateral feet. Right hallux medial border incurvated. No erythema edema or purulence noted.  Musculoskeletal: MMT 5/5 bilateral lower extremities in DF, PF, Inversion and Eversion. Deceased ROM in DF of ankle joint.  Neurological: Sensation intact to light touch.   Assessment:  No diagnosis found.   Plan:  Patient was evaluated and treated and all questions answered. Patient requesting removal of ingrown nail today. Procedure below.  Discussed procedure and post procedure care and patient expressed understanding.  Will follow-up in 2 weeks for nail check or sooner if any problems arise.    Procedure:  Procedure: partial Nail Avulsion of right hallux medial nail border.  Surgeon: Lorenda Peck, DPM  Pre-op Dx: Ingrown toenail without infection Post-op: Same  Place of Surgery: Office exam room.  Indications for surgery: Painful and ingrown toenail.    The patient is requesting removal of nail with chemical matrixectomy. Risks and complications were discussed with the patient for which they understand and written consent was obtained. Under  sterile conditions a total of 3 mL of  1% lidocaine plain was infiltrated in a hallux block fashion. Once anesthetized, the skin was prepped in sterile fashion. A tourniquet was then applied. Next the medial aspect of hallux nail border was then sharply excised making sure to remove the entire offending nail border.  Next phenol was then applied under standard conditions and copiously irrigated. Silvadene was applied. A dry sterile dressing was applied. After application of the dressing the tourniquet was removed and there is found to be an immediate capillary refill time to the digit. The patient tolerated the procedure well without any complications. Post procedure instructions were discussed the patient for which he verbally understood. Follow-up in two weeks for nail check or sooner if any problems are to arise. Discussed signs/symptoms of infection and directed to call the office immediately should any occur or go directly to the emergency room. In the meantime, encouraged to call the office with any questions, concerns, changes symptoms.   Lorenda Peck, DPM

## 2022-10-07 NOTE — Patient Instructions (Signed)

## 2022-10-21 ENCOUNTER — Encounter: Payer: Self-pay | Admitting: Podiatry

## 2022-10-21 ENCOUNTER — Ambulatory Visit: Payer: Medicare Other | Admitting: Podiatry

## 2022-10-21 DIAGNOSIS — L600 Ingrowing nail: Secondary | ICD-10-CM

## 2022-10-21 NOTE — Progress Notes (Signed)
  Subjective:  Patient ID: Sandra Carr, female    DOB: 1956/05/25,   MRN: [id number]  Chief Complaint  Patient presents with   Nail Problem    Patient state that her toe is still a little sore. No drainage. No swelling.    67 y.o. female presents for follow-up of ingrown nail procedure. Doing well with no pain . Denies any other pedal complaints. Denies n/v/f/c.   Past Medical History:  Diagnosis Date   Asthma     Objective:  Physical Exam: Vascular: DP/PT pulses 2/4 bilateral. CFT <3 seconds. Normal hair growth on digits. No edema.  Skin. No lacerations or abrasions bilateral feet. Right hallux nail is healing well.  Musculoskeletal: MMT 5/5 bilateral lower extremities in DF, PF, Inversion and Eversion. Deceased ROM in DF of ankle joint.  Neurological: Sensation intact to light touch.   Assessment:   1. Ingrown right greater toenail      Plan:  Patient was evaluated and treated and all questions answered. Toe was evaluated and appears to be healing well.  May discontinue soaks and neosporin.  Patient to follow-up as needed.    Lorenda Peck, DPM

## 2023-01-02 ENCOUNTER — Encounter (HOSPITAL_COMMUNITY): Payer: Self-pay

## 2023-01-02 ENCOUNTER — Emergency Department (HOSPITAL_COMMUNITY)
Admission: EM | Admit: 2023-01-02 | Discharge: 2023-01-02 | Disposition: A | Discharge status: Home / Self Care | Payer: Medicare Other | Attending: Emergency Medicine | Admitting: Emergency Medicine

## 2023-01-02 ENCOUNTER — Other Ambulatory Visit: Payer: Self-pay

## 2023-01-02 ENCOUNTER — Emergency Department (HOSPITAL_COMMUNITY): Payer: Medicare Other

## 2023-01-02 DIAGNOSIS — Z91040 Latex allergy status: Secondary | ICD-10-CM | POA: Insufficient documentation

## 2023-01-02 DIAGNOSIS — Z7951 Long term (current) use of inhaled steroids: Secondary | ICD-10-CM | POA: Insufficient documentation

## 2023-01-02 DIAGNOSIS — J45909 Unspecified asthma, uncomplicated: Secondary | ICD-10-CM | POA: Insufficient documentation

## 2023-01-02 DIAGNOSIS — I10 Essential (primary) hypertension: Secondary | ICD-10-CM | POA: Diagnosis not present

## 2023-01-02 DIAGNOSIS — R110 Nausea: Secondary | ICD-10-CM | POA: Diagnosis not present

## 2023-01-02 DIAGNOSIS — Z91010 Allergy to peanuts: Secondary | ICD-10-CM | POA: Diagnosis not present

## 2023-01-02 DIAGNOSIS — R519 Headache, unspecified: Secondary | ICD-10-CM | POA: Diagnosis not present

## 2023-01-02 DIAGNOSIS — R42 Dizziness and giddiness: Secondary | ICD-10-CM | POA: Insufficient documentation

## 2023-01-02 DIAGNOSIS — L723 Sebaceous cyst: Secondary | ICD-10-CM | POA: Insufficient documentation

## 2023-01-02 DIAGNOSIS — Z79899 Other long term (current) drug therapy: Secondary | ICD-10-CM | POA: Diagnosis not present

## 2023-01-02 LAB — COMPREHENSIVE METABOLIC PANEL
ALT: 13 U/L (ref 0–44)
AST: 14 U/L — ABNORMAL LOW (ref 15–41)
Albumin: 4 g/dL (ref 3.5–5.0)
Alkaline Phosphatase: 53 U/L (ref 38–126)
Anion gap: 9 (ref 5–15)
BUN: 13 mg/dL (ref 8–23)
CO2: 24 mmol/L (ref 22–32)
Calcium: 9.1 mg/dL (ref 8.9–10.3)
Chloride: 106 mmol/L (ref 98–111)
Creatinine, Ser: 0.78 mg/dL (ref 0.44–1.00)
GFR, Estimated: >60 mL/min (ref >60)
Glucose, Bld: 87 mg/dL (ref 70–99)
Potassium: 3.9 mmol/L (ref 3.5–5.1)
Sodium: 139 mmol/L (ref 135–145)
Total Bilirubin: 0.6 mg/dL (ref 0.3–1.2)
Total Protein: 7 g/dL (ref 6.5–8.1)

## 2023-01-02 LAB — URINALYSIS, ROUTINE W REFLEX MICROSCOPIC
Bilirubin Urine: NEGATIVE
Glucose, UA: NEGATIVE mg/dL
Hgb urine dipstick: NEGATIVE
Ketones, ur: NEGATIVE mg/dL
Leukocytes,Ua: NEGATIVE
Nitrite: NEGATIVE
Protein, ur: NEGATIVE mg/dL
Specific Gravity, Urine: 1.002 — ABNORMAL LOW (ref 1.005–1.030)
pH: 6 (ref 5.0–8.0)

## 2023-01-02 LAB — CBC WITH DIFFERENTIAL/PLATELET
Abs Immature Granulocytes: 0.02 10*3/uL (ref 0.00–0.07)
Basophils Absolute: 0.1 10*3/uL (ref 0.0–0.1)
Basophils Relative: 1 %
Eosinophils Absolute: 0.4 10*3/uL (ref 0.0–0.5)
Eosinophils Relative: 4 %
HCT: 34.9 % — ABNORMAL LOW (ref 36.0–46.0)
Hemoglobin: 11 g/dL — ABNORMAL LOW (ref 12.0–15.0)
Immature Granulocytes: 0 %
Lymphocytes Relative: 31 %
Lymphs Abs: 2.7 10*3/uL (ref 0.7–4.0)
MCH: 22.4 pg — ABNORMAL LOW (ref 26.0–34.0)
MCHC: 31.5 g/dL (ref 30.0–36.0)
MCV: 71.2 fL — ABNORMAL LOW (ref 80.0–100.0)
Monocytes Absolute: 0.6 10*3/uL (ref 0.1–1.0)
Monocytes Relative: 7 %
Neutro Abs: 5.1 10*3/uL (ref 1.7–7.7)
Neutrophils Relative %: 57 %
Platelets: 389 10*3/uL (ref 150–400)
RBC: 4.9 MIL/uL (ref 3.87–5.11)
RDW: 16.6 % — ABNORMAL HIGH (ref 11.5–15.5)
WBC: 8.9 10*3/uL (ref 4.0–10.5)
nRBC: 0 % (ref 0.0–0.2)

## 2023-01-02 MED ORDER — KETOROLAC TROMETHAMINE 30 MG/ML IJ SOLN
15.0000 mg | Freq: Once | INTRAMUSCULAR | Status: AC
  Administered 2023-01-02: 15 mg via INTRAVENOUS
  Filled 2023-01-02: qty 1

## 2023-01-02 MED ORDER — SODIUM CHLORIDE 0.9 % IV BOLUS
500.0000 mL | Freq: Once | INTRAVENOUS | Status: AC
  Administered 2023-01-02: 500 mL via INTRAVENOUS

## 2023-01-02 MED ORDER — METOCLOPRAMIDE HCL 5 MG/ML IJ SOLN
5.0000 mg | Freq: Once | INTRAMUSCULAR | Status: AC
  Administered 2023-01-02: 5 mg via INTRAVENOUS
  Filled 2023-01-02: qty 2

## 2023-01-02 MED ORDER — DIPHENHYDRAMINE HCL 50 MG/ML IJ SOLN
12.5000 mg | Freq: Once | INTRAMUSCULAR | Status: AC
  Administered 2023-01-02: 12.5 mg via INTRAVENOUS
  Filled 2023-01-02: qty 1

## 2023-01-02 NOTE — ED Triage Notes (Signed)
Patient has had a headache since Sunday. Patient feels like her eyes are heavy, sensitive to light. Has tried ibuprofen without relief. Slightly dizzy.

## 2023-01-02 NOTE — ED Provider Notes (Signed)
[place] [facility] DEPARTMENT AT [facility] Provider Note   CSN: [id number] Arrival date & time: 01/02/23  1727     History  Chief Complaint  Patient presents with   Headache    Sandra Carr is a 67 y.o. female.  67 y/o female with hx of controlled HTN on amlodipine and asthma presents with headache x 5 days. She was lying in bed when she felt headache with dizziness and nausea. She describes the pain as "pulsating" on the front and top of her head. She has tried allergy meds and ibuprofen without relief. She is not on blood thinners. No recent trauma. No chest pain, SOB, or fever.  The history is provided by the patient and a friend. No language interpreter was used.  Headache Timing:  Constant Progression:  Worsening Chronicity:  New      Home Medications Prior to Admission medications   Medication Sig Start Date End Date Taking? Authorizing Provider  albuterol (PROVENTIL HFA;VENTOLIN HFA) 108 (90 BASE) MCG/ACT inhaler Inhale 2 puffs into the lungs every 6 (six) hours as needed for wheezing or shortness of breath (wheezing).    [provider]  albuterol (PROVENTIL) (2.5 MG/3ML) 0.083% nebulizer solution Take 2.5 mg by nebulization every 6 (six) hours as needed for wheezing or shortness of breath (wheezing).    [provider]  amLODipine (NORVASC) 5 MG tablet Take 5 mg by mouth daily.    [provider]  benzonatate (TESSALON) 200 MG capsule Take 1 capsule (200 mg total) by mouth 3 (three) times daily as needed for cough. 06/11/22 06/11/23  Cobb, Ruby Cola, NP  cyclobenzaprine (FLEXERIL) 10 MG tablet Take 10 mg by mouth 3 (three) times daily. 06/15/21   [provider]  EPINEPHrine 0.3 mg/0.3 mL IJ SOAJ injection Inject into the muscle as needed. 05/01/21   [provider]  fluticasone (FLONASE) 50 MCG/ACT nasal spray Place 2 sprays into both nostrils daily. 05/22/22   Cobb, Ruby Cola, NP  fluticasone-salmeterol  (ADVAIR) 500-50 MCG/ACT AEPB Inhale 1 puff into the lungs in the morning and at bedtime. 02/08/21 03/10/21  Glade Lloyd, MD  fluticasone-salmeterol (ADVAIR) 500-50 MCG/ACT AEPB INHALE 1 DOSE    [provider]  meloxicam (MOBIC) 15 MG tablet Take 1 tablet (15 mg total) by mouth daily. 06/18/21   Louann Sjogren, DPM  montelukast (SINGULAIR) 10 MG tablet Take 1 tablet (10 mg total) by mouth at bedtime. 06/13/22   Cobb, Ruby Cola, NP  pantoprazole (PROTONIX) 40 MG tablet Take 1 tablet (40 mg total) by mouth daily. 02/08/21   Glade Lloyd, MD  Potassium Chloride ER 20 MEQ TBCR Take 1 tablet by mouth daily. 05/27/21   [provider]  sodium chloride (OCEAN) 0.65 % SOLN nasal spray Place 1 spray into both nostrils as needed for congestion.    [provider]  vitamin B-12 (CYANOCOBALAMIN) 1000 MCG tablet Take 1 tablet (1,000 mcg total) by mouth daily. 02/08/21   Glade Lloyd, MD  zolpidem (AMBIEN) 10 MG tablet Take 10 mg by mouth at bedtime as needed for sleep. 01/31/21   [provider]      Allergies    Other, Peanut-containing drug products, Contrast media [iodinated contrast media], Iohexol, Latex, Methocarbamol, Shellfish allergy, Triamcinolone, and Hydrochlorothiazide    Review of Systems   Review of Systems  Neurological:  Positive for headaches.  All other systems reviewed and are negative.   Physical Exam Updated Vital Signs BP (!) 171/91 (BP Location: Left  Arm)   Pulse 73   Temp 98.1 F (36.7 C) (Oral)   Resp 15   Ht 5\' 5"  (1.651 m)   Wt 63.5 kg   LMP 05/18/1993   SpO2 100%   BMI 23.30 kg/m  Physical Exam Vitals and nursing note reviewed.  Constitutional:      General: She is not in acute distress.    Appearance: She is well-developed.  HENT:     Head: Normocephalic.     Comments: 1cm area frontal mid scalp.  1cm area left sclp,  both appear to be sebaceous cyst.   Eyes:     Extraocular Movements: Extraocular movements intact.      Conjunctiva/sclera: Conjunctivae normal.  Cardiovascular:     Rate and Rhythm: Normal rate and regular rhythm.     Heart sounds: No murmur heard. Pulmonary:     Effort: Pulmonary effort is normal. No respiratory distress.     Breath sounds: Normal breath sounds.  Abdominal:     Palpations: Abdomen is soft.     Tenderness: There is no abdominal tenderness.  Musculoskeletal:        General: No swelling.     Cervical back: Normal range of motion and neck supple.  Skin:    General: Skin is warm and dry.     Capillary Refill: Capillary refill takes less than 2 seconds.  Neurological:     Mental Status: She is alert and oriented to person, place, and time.  Psychiatric:        Mood and Affect: Mood normal.     ED Results / Procedures / Treatments   Labs (all labs ordered are listed, but only abnormal results are displayed) Labs Reviewed  CBC WITH DIFFERENTIAL/PLATELET - Abnormal; Notable for the following components:      Result Value   Hemoglobin 11.0 (*)    HCT 34.9 (*)    MCV 71.2 (*)    MCH 22.4 (*)    RDW 16.6 (*)    All other components within normal limits  URINALYSIS, ROUTINE W REFLEX MICROSCOPIC - Abnormal; Notable for the following components:   Color, Urine COLORLESS (*)    Specific Gravity, Urine 1.002 (*)    All other components within normal limits  COMPREHENSIVE METABOLIC PANEL    EKG None  Radiology No results found.  Procedures Procedures    Medications Ordered in ED Medications - No data to display  ED Course/ Medical Decision Making/ A&P                             Medical Decision Making Patient complains of a headache that she has had for the past week patient also complains of lumps on her head that she has had for an extended period of time  Amount and/or Complexity of Data Reviewed Independent Historian: friend    Details: Has friends here with her.  Who are supportive Labs: ordered. Decision-making details documented in ED Course.     Details: Abs ordered reviewed and interpreted patient has a normal white blood cell count chemistries are normal Radiology: ordered and independent interpretation performed.    Details: ET head shows no acute intercranial pathology CT does show sebaceous cyst  Risk Prescription drug management. Risk Details: Patient is given a migraine cocktail here.  Patient reports she feels better after receiving the medicines.  Patient is advised to schedule follow-up with her primary care physician.  Patient is advised to follow-up  with dermatology for sebaceous cyst on her scalp.           Final Clinical Impression(s) / ED Diagnoses Final diagnoses:  Nonintractable headache, unspecified chronicity pattern, unspecified headache type  Sebaceous cyst    Rx / DC Orders ED Discharge Orders     None      An After Visit Summary was printed and given to the patient.    Osie Cheeks 01/02/23 2121    Arby Barrette, MD 01/07/23 1242

## 2023-01-22 ENCOUNTER — Ambulatory Visit: Payer: Medicare Other | Admitting: Neurology

## 2023-01-25 ENCOUNTER — Other Ambulatory Visit: Payer: Self-pay | Admitting: Nurse Practitioner

## 2023-01-25 DIAGNOSIS — J4551 Severe persistent asthma with (acute) exacerbation: Secondary | ICD-10-CM

## 2023-02-14 ENCOUNTER — Ambulatory Visit: Payer: Medicare Other | Admitting: Neurology

## 2023-02-14 ENCOUNTER — Encounter: Payer: Self-pay | Admitting: Neurology

## 2023-02-14 VITALS — BP 128/77 | HR 67 | Ht 65.0 in | Wt 139.0 lb

## 2023-02-14 DIAGNOSIS — H539 Unspecified visual disturbance: Secondary | ICD-10-CM

## 2023-02-14 DIAGNOSIS — R509 Fever, unspecified: Secondary | ICD-10-CM | POA: Diagnosis not present

## 2023-02-14 DIAGNOSIS — Z7952 Long term (current) use of systemic steroids: Secondary | ICD-10-CM | POA: Diagnosis not present

## 2023-02-14 DIAGNOSIS — R51 Headache with orthostatic component, not elsewhere classified: Secondary | ICD-10-CM

## 2023-02-14 DIAGNOSIS — G43901 Migraine, unspecified, not intractable, with status migrainosus: Secondary | ICD-10-CM

## 2023-02-14 DIAGNOSIS — R519 Headache, unspecified: Secondary | ICD-10-CM

## 2023-02-14 DIAGNOSIS — G441 Vascular headache, not elsewhere classified: Secondary | ICD-10-CM

## 2023-02-14 MED ORDER — NURTEC 75 MG PO TBDP
75.0000 mg | ORAL_TABLET | Freq: Every day | ORAL | 0 refills | Status: AC | PRN
Start: 2023-02-14 — End: ?

## 2023-02-14 NOTE — Progress Notes (Unsigned)
[facility]    Provider:  Dr Lucia Gaskins Requesting Provider: Jackelyn Poling, DO Primary Care Provider:  Jackelyn Poling, DO  CC:  new onset headache  HPI:  BEATRIS Carr is a 67 y.o. female here as requested by Jackelyn Poling, DO for headaches. Has had a headache since April. Never a hx of migraines. has Tachycardia; Low back pain; Pain in right knee; Subjective tinnitus, bilateral; Asthma exacerbation; Anemia; Adjustment disorder with mixed anxiety and depressed mood; Cervical radiculopathy; Cervical spondylosis; Chronic atrophic gastritis; Disorder of soft tissue; Gastroesophageal reflux disease without esophagitis; Hearing loss; Hypercalcemia; Hyperglycemia; Hypertension; Insomnia; Iron deficiency anemia; Irritable bowel syndrome; Neck pain; Osteopenia; Overactive bladder; Pain in wrist; Palpitations; Paroxysmal cough; Prediabetes; Rash; Sciatica; Shoulder joint pain; Sleep disturbance; Vitamin D deficiency; Skin sensation disturbance; Allergic rhinitis; Asthma; Age-related osteoporosis without current pathological fracture; Primary hyperparathyroidism (HCC); and URI (upper respiratory infection) on their problem list.  Headaches started in April. No inciting events. He has been having more allergies. Grabddaughter with migraines. Feels pulsing/throbbing, light and sound sensitivity, feel pulsating in both ears, light  and sound sensitivity, light makes it worse and hurts her eyes, she is having vision changes. Running a fever the last few days. Nausea. Mostly during the day. She feels bette rin the morning worse at stand up throughout the day, positional and when she moves around. Daily, worsening, moderate to severe. In the temples and forehead, a dar room helps, also in the neck tightness and neck pain , muscle aches and pains, no jaw pain. Taking ibuprofen, excedrin nothing is helping a lot. Started amox, steroid taper 60-10mg  which she has not taken yet, No other focal neurologic  deficits, associated symptoms, inciting events or modifiable factors.   Reviewed notes, labs and imaging from outside physicians, which showed:  CLINICAL DATA:  Headache   EXAM: CT HEAD WITHOUT CONTRAST   TECHNIQUE: Contiguous axial images were obtained from the base of the skull through the vertex without intravenous contrast.   RADIATION DOSE REDUCTION: This exam was performed according to the departmental dose-optimization program which includes automated exposure control, adjustment of the mA and/or kV according to patient size and/or use of iterative reconstruction technique.   COMPARISON:  None Available.   FINDINGS: Brain: No evidence of acute infarction, hemorrhage, hydrocephalus, extra-axial collection or mass lesion/mass effect.   Vascular: No hyperdense vessel or unexpected calcification.   Skull: Normal. Negative for fracture or focal lesion. Small subcutaneous lesion along the right frontal scalp in the left parietal scalp is favored to represent a sebaceous cyst.   Sinuses/Orbits: No middle ear or mastoid effusion. Paranasal sinuses are clear. Orbits are unremarkable.   Other: None.   IMPRESSION: No acute intracranial abnormality. No specific etiology for headaches identified.    Recent Results (from the past 2160 hour(s))  Urinalysis, Routine w reflex microscopic -Urine, Clean Catch     Status: Abnormal   Collection Time: 01/02/23  7:09 PM  Result Value Ref Range   Color, Urine COLORLESS (A) YELLOW   APPearance CLEAR CLEAR   Specific Gravity, Urine 1.002 (L) 1.005 - 1.030   pH 6.0 5.0 - 8.0   Glucose, UA NEGATIVE NEGATIVE mg/dL   Hgb urine dipstick NEGATIVE NEGATIVE   Bilirubin Urine NEGATIVE NEGATIVE   Ketones, ur NEGATIVE NEGATIVE mg/dL   Protein, ur NEGATIVE NEGATIVE mg/dL   Nitrite NEGATIVE NEGATIVE   Leukocytes,Ua NEGATIVE NEGATIVE    Comment: Performed at [facility], [address], [place], [place] [address]  CBC  with  Differential     Status: Abnormal   Collection Time: 01/02/23  7:20 PM  Result Value Ref Range   WBC 8.9 4.0 - 10.5 K/uL   RBC 4.90 3.87 - 5.11 MIL/uL   Hemoglobin 11.0 (L) 12.0 - 15.0 g/dL   HCT 16.1 (L) 09.6 - 04.5 %   MCV 71.2 (L) 80.0 - 100.0 fL   MCH 22.4 (L) 26.0 - 34.0 pg   MCHC 31.5 30.0 - 36.0 g/dL   RDW 40.9 (H) 81.1 - 91.4 %   Platelets 389 150 - 400 K/uL   nRBC 0.0 0.0 - 0.2 %   Neutrophils Relative % 57 %   Neutro Abs 5.1 1.7 - 7.7 K/uL   Lymphocytes Relative 31 %   Lymphs Abs 2.7 0.7 - 4.0 K/uL   Monocytes Relative 7 %   Monocytes Absolute 0.6 0.1 - 1.0 K/uL   Eosinophils Relative 4 %   Eosinophils Absolute 0.4 0.0 - 0.5 K/uL   Basophils Relative 1 %   Basophils Absolute 0.1 0.0 - 0.1 K/uL   Immature Granulocytes 0 %   Abs Immature Granulocytes 0.02 0.00 - 0.07 K/uL    Comment: Performed at [facility], [address] [place], [place] [address]  Comprehensive metabolic panel     Status: Abnormal   Collection Time: 01/02/23  7:20 PM  Result Value Ref Range   Sodium 139 135 - 145 mmol/L   Potassium 3.9 3.5 - 5.1 mmol/L   Chloride 106 98 - 111 mmol/L   CO2 24 22 - 32 mmol/L   Glucose, Bld 87 70 - 99 mg/dL    Comment: Glucose reference range applies only to samples taken after fasting for at least 8 hours.   BUN 13 8 - 23 mg/dL   Creatinine, Ser 6.21 0.44 - 1.00 mg/dL   Calcium 9.1 8.9 - 30.8 mg/dL   Total Protein 7.0 6.5 - 8.1 g/dL   Albumin 4.0 3.5 - 5.0 g/dL   AST 14 (L) 15 - 41 U/L   ALT 13 0 - 44 U/L   Alkaline Phosphatase 53 38 - 126 U/L   Total Bilirubin 0.6 0.3 - 1.2 mg/dL   GFR, Estimated >65 >78 mL/min    Comment: (NOTE) Calculated using the CKD-EPI Creatinine Equation (2021)    Anion gap 9 5 - 15    Comment: Performed at [facility], [address] [place], [place] [address]  Sedimentation rate     Status: None   Collection Time: 02/14/23 10:49 AM  Result Value Ref Range   Sed Rate 7 0 - 40 mm/hr   C-reactive protein     Status: None   Collection Time: 02/14/23 10:49 AM  Result Value Ref Range   CRP 4 0 - 10 mg/L  CBC with Differential/Platelets     Status: Abnormal   Collection Time: 02/14/23 10:49 AM  Result Value Ref Range   WBC 5.3 3.4 - 10.8 x10E3/uL   RBC 5.38 (H) 3.77 - 5.28 x10E6/uL   Hemoglobin 11.9 11.1 - 15.9 g/dL   Hematocrit 95.2 84.1 - 46.6 %   MCV 72 (L) 79 - 97 fL   MCH 22.1 (L) 26.6 - 33.0 pg   MCHC 30.6 (L) 31.5 - 35.7 g/dL   RDW 32.4 (H) 40.1 - 02.7 %   Platelets 413 150 - 450 x10E3/uL   Neutrophils 52 Not Estab. %   Lymphs 34 Not Estab. %   Monocytes 10 Not Estab. %  Eos 3 Not Estab. %   Basos 1 Not Estab. %   Neutrophils Absolute 2.7 1.4 - 7.0 x10E3/uL   Lymphocytes Absolute 1.8 0.7 - 3.1 x10E3/uL   Monocytes Absolute 0.5 0.1 - 0.9 x10E3/uL   EOS (ABSOLUTE) 0.2 0.0 - 0.4 x10E3/uL   Basophils Absolute 0.1 0.0 - 0.2 x10E3/uL   Immature Granulocytes 0 Not Estab. %   Immature Grans (Abs) 0.0 0.0 - 0.1 x10E3/uL  Comprehensive metabolic panel     Status: Abnormal   Collection Time: 02/14/23 10:49 AM  Result Value Ref Range   Glucose 79 70 - 99 mg/dL   BUN 10 8 - 27 mg/dL   Creatinine, Ser 1.61 0.57 - 1.00 mg/dL   eGFR 94 >09 UE/AVW/0.98   BUN/Creatinine Ratio 14 12 - 28   Sodium 144 134 - 144 mmol/L   Potassium 3.7 3.5 - 5.2 mmol/L   Chloride 107 (H) 96 - 106 mmol/L   CO2 23 20 - 29 mmol/L   Calcium 9.6 8.7 - 10.3 mg/dL   Total Protein 7.1 6.0 - 8.5 g/dL   Albumin 4.4 3.9 - 4.9 g/dL   Globulin, Total 2.7 1.5 - 4.5 g/dL   Albumin/Globulin Ratio 1.6 1.2 - 2.2   Bilirubin Total 0.5 0.0 - 1.2 mg/dL   Alkaline Phosphatase 75 44 - 121 IU/L   AST 14 0 - 40 IU/L   ALT 11 0 - 32 IU/L  Hemoglobin A1c     Status: Abnormal   Collection Time: 02/14/23 10:49 AM  Result Value Ref Range   Hgb A1c MFr Bld 5.7 (H) 4.8 - 5.6 %    Comment:          Prediabetes: 5.7 - 6.4          Diabetes: >6.4          Glycemic control for adults with diabetes: <7.0     Est. average glucose Bld gHb Est-mCnc 117 mg/dL  TSH Rfx on Abnormal to Free T4     Status: None   Collection Time: 02/14/23 10:52 AM  Result Value Ref Range   TSH 2.070 0.450 - 4.500 uIU/mL     Review of Systems: Patient complains of symptoms per HPI as well as the following symptoms headache, fever. Pertinent negatives and positives per HPI. All others negative.   Social History   Socioeconomic History   Marital status: Divorced    Spouse name: Not on file   Number of children: 2   Years of education: Not on file   Highest education level: Not on file  Occupational History   Not on file  Tobacco Use   Smoking status: Former    Packs/day: 0.25    Years: 20.00    Additional pack years: 0.00    Total pack years: 5.00    Types: Cigarettes    Quit date: 47    Years since quitting: 26.4   Smokeless tobacco: Never  Vaping Use   Vaping Use: Never used  Substance and Sexual Activity   Alcohol use: Yes    Comment: occ wine   Drug use: Never   Sexual activity: Not on file  Other Topics Concern   Not on file  Social History Narrative   Not on file   Social Determinants of Health   Financial Resource Strain: Not on file  Food Insecurity: Not on file  Transportation Needs: Not on file  Physical Activity: Not on file  Stress: Not on file  Social Connections: Not  on file  Intimate Partner Violence: Not on file    Family History  Problem Relation Age of Onset   Breast cancer Cousin    Hypertension Mother    Diabetes Mother    Hypertension Father    Heart failure Brother    Heart disease Brother     Past Medical History:  Diagnosis Date   Asthma     Patient Active Problem List   Diagnosis Date Noted   URI (upper respiratory infection) 05/22/2022   Age-related osteoporosis without current pathological fracture 06/18/2021   Primary hyperparathyroidism (HCC) 06/18/2021   Asthma 04/04/2021   Adjustment disorder with mixed anxiety and depressed mood 02/16/2021    Cervical radiculopathy 02/16/2021   Cervical spondylosis 02/16/2021   Chronic atrophic gastritis 02/16/2021   Disorder of soft tissue 02/16/2021   Gastroesophageal reflux disease without esophagitis 02/16/2021   Hearing loss 02/16/2021   Hypercalcemia 02/16/2021   Hyperglycemia 02/16/2021   Hypertension 02/16/2021   Insomnia 02/16/2021   Iron deficiency anemia 02/16/2021   Irritable bowel syndrome 02/16/2021   Neck pain 02/16/2021   Osteopenia 02/16/2021   Overactive bladder 02/16/2021   Pain in wrist 02/16/2021   Palpitations 02/16/2021   Paroxysmal cough 02/16/2021   Prediabetes 02/16/2021   Rash 02/16/2021   Sciatica 02/16/2021   Shoulder joint pain 02/16/2021   Sleep disturbance 02/16/2021   Vitamin D deficiency 02/16/2021   Skin sensation disturbance 02/16/2021   Allergic rhinitis 02/16/2021   Anemia 02/07/2021   Asthma exacerbation 02/06/2021   Low back pain 11/29/2019   Pain in right knee 11/16/2019   Subjective tinnitus, bilateral 10/09/2017   Tachycardia 01/25/2016    Past Surgical History:  Procedure Laterality Date   ABDOMINAL HYSTERECTOMY     FOOT SURGERY Left     Current Outpatient Medications  Medication Sig Dispense Refill   albuterol (PROVENTIL HFA;VENTOLIN HFA) 108 (90 BASE) MCG/ACT inhaler Inhale 2 puffs into the lungs every 6 (six) hours as needed for wheezing or shortness of breath (wheezing).     albuterol (PROVENTIL) (2.5 MG/3ML) 0.083% nebulizer solution Take 2.5 mg by nebulization every 6 (six) hours as needed for wheezing or shortness of breath (wheezing).     amLODipine (NORVASC) 5 MG tablet Take 5 mg by mouth daily.     benzonatate (TESSALON) 200 MG capsule Take 1 capsule (200 mg total) by mouth 3 (three) times daily as needed for cough. 30 capsule 1   cyclobenzaprine (FLEXERIL) 10 MG tablet Take 10 mg by mouth 3 (three) times daily.     EPINEPHrine 0.3 mg/0.3 mL IJ SOAJ injection Inject into the muscle as needed.     fluticasone (FLONASE)  50 MCG/ACT nasal spray Place 2 sprays into both nostrils daily. 18.2 mL 2   fluticasone-salmeterol (ADVAIR) 500-50 MCG/ACT AEPB INHALE 1 DOSE     meloxicam (MOBIC) 15 MG tablet Take 1 tablet (15 mg total) by mouth daily. 30 tablet 0   montelukast (SINGULAIR) 10 MG tablet TAKE 1 TABLET BY MOUTH AT BEDTIME 30 tablet 2   pantoprazole (PROTONIX) 40 MG tablet Take 1 tablet (40 mg total) by mouth daily. 30 tablet 0   Potassium Chloride ER 20 MEQ TBCR Take 1 tablet by mouth daily.     Rimegepant Sulfate (NURTEC) 75 MG TBDP Take 1 tablet (75 mg total) by mouth daily as needed. For migraines. Take as close to onset of migraine as possible. One daily maximum. 8 tablet 0   sodium chloride (OCEAN) 0.65 % SOLN nasal spray Place  1 spray into both nostrils as needed for congestion.     vitamin B-12 (CYANOCOBALAMIN) 1000 MCG tablet Take 1 tablet (1,000 mcg total) by mouth daily. 30 tablet 0   zolpidem (AMBIEN) 10 MG tablet Take 10 mg by mouth at bedtime as needed for sleep.     No current facility-administered medications for this visit.    Allergies as of 02/14/2023 - Review Complete 02/14/2023  Allergen Reaction Noted   Other Hives and Anaphylaxis 11/28/2020   Peanut-containing drug products Shortness Of Breath 12/18/2014   Contrast media [iodinated contrast media]  02/16/2021   Iohexol  06/30/2010   Latex Swelling 12/18/2014   Methocarbamol Swelling 11/28/2020   Shellfish allergy Other (See Comments) 12/18/2014   Triamcinolone Other (See Comments) 11/28/2020   Hydrochlorothiazide Rash 11/28/2020    Vitals: BP 128/77   Pulse 67   Ht 5\' 5"  (1.651 m)   Wt 139 lb (63 kg)   LMP 05/18/1993   BMI 23.13 kg/m  Last Weight:  Wt Readings from Last 1 Encounters:  02/14/23 139 lb (63 kg)   Last Height:   Ht Readings from Last 1 Encounters:  02/14/23 5\' 5"  (1.651 m)     Physical exam: Exam: Gen: NAD, conversant, well nourised, well groomed                     CV: RRR, no MRG. No Carotid Bruits.  No peripheral edema, warm, nontender Eyes: Conjunctivae clear without exudates or hemorrhage Temporal tenderness  Neuro: Detailed Neurologic Exam  Speech:    Speech is normal; fluent and spontaneous with normal comprehension.  Cognition:    The patient is oriented to person, place, and time;     recent and remote memory intact;     language fluent;     normal attention, concentration,     fund of knowledge Cranial Nerves:    The pupils are equal, round, and reactive to light. Attempted, pupils too small to visualize Visual fields are full to finger confrontation. Extraocular movements are intact. Trigeminal sensation is intact and the muscles of mastication are normal. The face is symmetric. The palate elevates in the midline. Hearing intact. Voice is normal. Shoulder shrug is normal. The tongue has normal motion without fasciculations.   Coordination: nml  Gait: nml  Motor Observation:    No asymmetry, no atrophy, and no involuntary movements noted. Tone:    Normal muscle tone.    Posture:    Posture is normal. normal erect    Strength:    Strength is V/V in the upper and lower limbs.      Sensation: intact to LT     Reflex Exam:  DTR's:    Deep tendon reflexes in the upper and lower extremities are symmetircal bilaterally.   Toes:    The toes are downgoing bilaterally.   Clonus:    Clonus is absent.    Assessment/Plan:  patient with new onset headache at age 40f 56. Associated concerning symptoms of vision changes, fever and other, needs full workup.   Expedite eye exam. Migraine cocktail with load of steroids today Tomorrow start steroids taper Bloodwork MRI brain w/wo contrast: for new headache at age of 28, fever, evaluate for meningitis, GCA, space occupying mass, chiari or intracranial hypertension (pseudotumor), strokes, malignancies, vasculidities, demyelination(multiple sclerosis) or other  Nurtec once daily as needed  Orders Placed This Encounter   Procedures   MR BRAIN W WO CONTRAST   Sedimentation rate   C-reactive protein  TSH Rfx on Abnormal to Free T4   CBC with Differential/Platelets   Comprehensive metabolic panel   Hemoglobin A1c   Meds ordered this encounter  Medications   Rimegepant Sulfate (NURTEC) 75 MG TBDP    Sig: Take 1 tablet (75 mg total) by mouth daily as needed. For migraines. Take as close to onset of migraine as possible. One daily maximum.    Dispense:  8 tablet    Refill:  0    Cc: Jackelyn Poling, DO,  Jackelyn Poling, DO  Naomie Dean, MD  [facility] [address] [place], [place] [address]  Phone [phone] Fax [phone]

## 2023-02-14 NOTE — Patient Instructions (Addendum)
Migraine cocktail with load of steroids Tomorrow start steroids taper Bloodwork MRI brain w/wo contrast Nurtec once daily as needed    Temporal arteritis is a condition that makes your arteries become inflamed. It can happen in any part of the body. Most often, it affects your head and face. It is also called giant cell arteritis. This condition can cause severe problems such as blindness. Early treatment can help prevent those problems. What are the causes? The cause of this condition is not known. What increases the risk? You may be more likely to get this condition if: You are older than 69 years of age. You are female. You are Caucasian. You are of [place], [place], [place], [place], or [place] ancestry. You have a family history of the condition. You have polymyalgia rheumatica (PMR). This is a condition that causes muscle pain and stiffness. What are the signs or symptoms? Some people with this condition have just one symptom. Others have more than one symptom. Most symptoms are related to the head and face. These may include: Headache. Hard, swollen, or tender temples. Your temples are the areas on either side of your forehead. Pain when you comb your hair or when you lay your head down. Pain in your jaw when you chew. Pain in your throat or tongue. Problems with your vision. These may include sudden loss of vision in one eye or seeing double. Other symptoms may include: Fever. Feeling tired (fatigue). A dry cough. Pain in your hips and shoulders. Pain in your arms when you exercise. Depression. Weight loss. How is this diagnosed? This condition may be diagnosed based on your symptoms, your medical history, and a physical exam. You may also have tests done. These may include: Blood tests. Biopsy. This is when a small piece of tissue is removed for testing. Imaging tests, such as an ultrasound or MRI. How is this treated? This condition may be treated with  medicines to: Reduce inflammation (steroids). Weaken your immune system (immunosuppressants). Your immune system is your body's defense system. Treat vision problems. Follow these instructions at home: Medicines Take over-the-counter and prescription medicines only as told by your health care provider. Take any vitamins or supplements that your provider recommends. These may include vitamin D and calcium to help stop your bones from getting weak. Eating and drinking  Eat foods that are good for your heart. These may include: Foods high in fiber, such as fresh fruits and vegetables, whole grains, and beans. Foods that have healthy fats in them (omega-3 fats), such as fish, flaxseed, and flaxseed oil. Limit foods that are high in saturated fat and cholesterol. These include processed and fried foods, fatty meat, and full-fat dairy. Limit how much salt (sodium) you eat. Include calcium and vitamin D in your diet. Good sources of these include: Low-fat dairy products, such as milk, yogurt, and cheese. Certain fish, such as fresh or canned salmon, tuna, and sardines. Fortified products. This means that they have calcium and vitamin D added to them. These include fortified cereals and juice. General instructions Exercise. Talk with your provider about what exercises are safe for you. You may be told to do exercises that make your heart beat faster (aerobic exercise). These can help control your blood pressure and prevent bone loss. Stay up-to-date on all of your vaccines as told by your provider. Keep all follow-up visits. The medicines used to treat this condition can make you more likely to have problems such as bone loss or diabetes. Your provider will  check for problems during your follow-up visits. Contact a health care provider if: Your symptoms get worse. You have any signs of infection. These may include fever, swelling, redness, warmth, or tenderness. Get help right away if: You lose  your vision. Your pain does not go away, even after you take medicine. You have chest pain. You have trouble breathing. One side of your face or body gets weak or numb all of a sudden. These symptoms may be an emergency. Get help right away. Call 911. Do not wait to see if the symptoms will go away. Do not drive yourself to the hospital. This information is not intended to replace advice given to you by your health care provider. Make sure you discuss any questions you have with your health care provider. Document Revised: 06/10/2022 Document Reviewed: 06/10/2022 Elsevier Patient Education  2024 Elsevier Inc[employer] octor gave you and amoxicillin If tests elevated, biopsy for temporal arteritis If tests are not elevated finish the steroid taper and MRI of the brain w/wo contrast  Valproate Sodium Injection What is this medication? VALPROATE SODIUM (val PRO ate SO dee um) prevents and controls seizures in people with epilepsy. It works by calming overactive nerves in your body. This medicine may be used for other purposes; ask your health care provider or pharmacist if you have questions. COMMON BRAND NAME(S): Depacon What should I tell my care team before I take this medication? They need to know if you have any of these conditions: Frequently drink alcohol Kidney disease Liver disease Low platelet counts Mitochondrial disease Suicidal thoughts, plans, or attempt by you or a family member Urea cycle disorder (UCD) An unusual or allergic reaction to divalproex sodium, sodium valproate, valproic acid, other medications, foods, dyes, or preservatives Pregnant or trying to get pregnant Breast-feeding How should I use this medication? This medication is for infusion into a vein. It is given by your care team in a hospital or clinic setting. Talk to your care team about the use of this medication in children. While this medication may be prescribed for children as young as 10 years for selected  conditions, precautions do apply. Overdosage: If you think you have taken too much of this medicine contact a poison control center or emergency room at once. NOTE: This medicine is only for you. Do not share this medicine with others. What if I miss a dose? This does not apply. What may interact with this medication? Do not take this medication with any of the following: Sodium phenylbutyrate This medication may also interact with the following: Aspirin Certain antibiotics, such as ertapenem, imipenem, meropenem Certain medications for depression, anxiety, or other mental health conditions Certain medications for seizures, such as cannabidiol, carbamazepine, clonazepam, diazepam, ethosuximide, felbamate, lamotrigine, phenobarbital, phenytoin, primidone, rufinamide, topiramate Certain medications that treat or prevent blood clots, such as warfarin Estrogen and progestin hormones Methotrexate Propofol Rifampin Ritonavir Tolbutamide Zidovudine This list may not describe all possible interactions. Give your health care provider a list of all the medicines, herbs, non-prescription drugs, or dietary supplements you use. Also tell them if you smoke, drink alcohol, or use illegal drugs. Some items may interact with your medicine. What should I watch for while using this medication? Tell your care team if your symptoms do not get better or they start to get worse. This medication may cause serious skin reactions. They can happen weeks to months after starting the medication. Contact your care team right away if you notice fevers or flu-like symptoms with a  rash. The rash may be red or purple and then turn into blisters or peeling of the skin. Or, you might notice a red rash with swelling of the face, lips or lymph nodes in your neck or under your arms. Wear a medical ID bracelet or chain, and carry a card that describes your disease and details of your medication and dosage times. You may get  drowsy, dizzy, or have blurred vision. Do not drive, use machinery, or do anything that needs mental alertness until you know how this medication affects you. To reduce dizzy or fainting spells, do not sit or stand up quickly, especially if you are an older patient. Alcohol can increase drowsiness and dizziness. Avoid alcoholic drinks. This medication can make you more sensitive to the sun. Keep out of the sun. If you cannot avoid being in the sun, wear protective clothing and use sunscreen. Do not use sun lamps or tanning beds/booths. Patients and their families should watch out for new or worsening depression or thoughts of suicide. Also watch out for sudden changes in feelings such as feeling anxious, agitated, panicky, irritable, hostile, aggressive, impulsive, severely restless, overly excited and hyperactive, or not being able to sleep. If this happens, especially at the beginning of treatment or after a change in dose, call your care team. Women should inform their care team if they wish to become pregnant or think they might be pregnant. There is a potential for serious side effects to an unborn child. Talk to your care team or pharmacist for more information. Women who become pregnant while using this medication may enroll in the [place] American Antiepileptic Drug Pregnancy Registry by calling [phone]. This registry collects information about the safety of antiepileptic medication use during pregnancy. This medication may cause a decrease in folic acid and vitamin D. You should make sure that you get enough vitamins while you are taking this medication. Discuss the foods you eat and the vitamins you take with your care team. What side effects may I notice from receiving this medication? Side effects that you should report to your care team as soon as possible: Allergic reactions--skin rash, itching, hives, swelling of the face, lips, tongue, or throat High ammonia level--unusual weakness or  fatigue, confusion, loss of appetite, nausea, vomiting, seizures Liver injury--right upper belly pain, loss of appetite, nausea, light-colored stool, dark yellow or brown urine, yellowing skin or eyes, unusual weakness or fatigue Low body temperature, drowsiness, confusion Pancreatitis--severe stomach pain that spreads to your back or gets worse after eating or when touched, fever, nausea, vomiting Rash, fever, and swollen lymph nodes Thoughts of suicide or self-harm, worsening mood, feelings of depression Unusual bruising or bleeding Side effects that usually do not require medical attention (report to your care team if they continue or are bothersome): Change in taste Change in vision Dizziness Drowsiness Headache Nausea Pain, redness, or irritation at injection site This list may not describe all possible side effects. Call your doctor for medical advice about side effects. You may report side effects to FDA at 1-800-FDA-1088. Where should I keep my medication? This medication is given in a hospital or clinic and will not be stored at home. NOTE: This sheet is a summary. It may not cover all possible information. If you have questions about this medicine, talk to your doctor, pharmacist, or health care provider.  2024 Elsevier/Gold Standard (2021-12-05 00:00:00) Prednisone Tablets What is this medication? PREDNISONE (PRED ni sone) treats many conditions such as asthma, allergic reactions, arthritis, inflammatory  bowel diseases, adrenal, and blood or bone marrow disorders. It works by decreasing inflammation, slowing down an overactive immune system, or replacing cortisol normally made in the body. Cortisol is a hormone that plays an important role in how the body responds to stress, illness, and injury. It belongs to a group of medications called steroids. This medicine may be used for other purposes; ask your health care provider or pharmacist if you have questions. COMMON BRAND NAME(S):  Deltasone, Predone, Sterapred, Sterapred DS What should I tell my care team before I take this medication? They need to know if you have any of these conditions: Cushing's syndrome Diabetes Glaucoma Heart disease High blood pressure Infection (especially a virus infection such as chickenpox, cold sores, or herpes) Kidney disease Liver disease Mental illness Myasthenia gravis Osteoporosis Seizures Stomach or intestine problems Thyroid disease An unusual or allergic reaction to lactose, prednisone, other medications, foods, dyes, or preservatives Pregnant or trying to get pregnant Breast-feeding How should I use this medication? Take this medication by mouth with a glass of water. Follow the directions on the prescription label. Take this medication with food. If you are taking this medication once a day, take it in the morning. Do not take more medication than you are told to take. Do not suddenly stop taking your medication because you may develop a severe reaction. Your care team will tell you how much medication to take. If your care team wants you to stop the medication, the dose may be slowly lowered over time to avoid any side effects. Talk to your care team about the use of this medication in children. Special care may be needed. Overdosage: If you think you have taken too much of this medicine contact a poison control center or emergency room at once. NOTE: This medicine is only for you. Do not share this medicine with others. What if I miss a dose? If you miss a dose, take it as soon as you can. If it is almost time for your next dose, talk to your care team. You may need to miss a dose or take an extra dose. Do not take double or extra doses without advice. What may interact with this medication? Do not take this medication with any of the following: Metyrapone Mifepristone This medication may also interact with the following: Aminoglutethimide Amphotericin B Aspirin and  aspirin-like medications Barbiturates Certain medications for diabetes, like glipizide or glyburide Cholestyramine Cholinesterase inhibitors Cyclosporine Digoxin Diuretics Ephedrine Female hormones, like estrogens and birth control pills Isoniazid Ketoconazole NSAIDS, medications for pain and inflammation, like ibuprofen or naproxen Phenytoin Rifampin Toxoids Vaccines Warfarin This list may not describe all possible interactions. Give your health care provider a list of all the medicines, herbs, non-prescription drugs, or dietary supplements you use. Also tell them if you smoke, drink alcohol, or use illegal drugs. Some items may interact with your medicine. What should I watch for while using this medication? Visit your care team for regular checks on your progress. If you are taking this medication over a prolonged period, carry an identification card with your name and address, the type and dose of your medication, and your care team's name and address. This medication may increase your risk of getting an infection. Tell your care team if you are around anyone with measles or chickenpox, or if you develop sores or blisters that do not heal properly. If you are going to have surgery, tell your care team that you have taken this medication within the  last twelve months. Ask your care team about your diet. You may need to lower the amount of salt you eat. This medication may increase blood sugar. Ask your care team if changes in diet or medications are needed if you have diabetes. What side effects may I notice from receiving this medication? Side effects that you should report to your care team as soon as possible: Allergic reactions--skin rash, itching, hives, swelling of the face, lips, tongue, or throat Cushing syndrome--increased fat around the midsection, upper back, neck, or face, pink or purple stretch marks on the skin, thinning, fragile skin that easily bruises, unexpected hair  growth High blood sugar (hyperglycemia)--increased thirst or amount of urine, unusual weakness or fatigue, blurry vision Increase in blood pressure Infection--fever, chills, cough, sore throat, wounds that don't heal, pain or trouble when passing urine, general feeling of discomfort or being unwell Low adrenal gland function--nausea, vomiting, loss of appetite, unusual weakness or fatigue, dizziness Mood and behavior changes--anxiety, nervousness, confusion, hallucinations, irritability, hostility, thoughts of suicide or self-harm, worsening mood, feelings of depression Stomach bleeding--bloody or black, tar-like stools, vomiting blood or brown material that looks like coffee grounds Swelling of the ankles, hands, or feet Side effects that usually do not require medical attention (report to your care team if they continue or are bothersome): Acne General discomfort and fatigue Headache Increase in appetite Nausea Trouble sleeping Weight gain This list may not describe all possible side effects. Call your doctor for medical advice about side effects. You may report side effects to FDA at 1-800-FDA-1088. Where should I keep my medication? Keep out of the reach of children. Store at room temperature between 15 and 30 degrees C (59 and 86 degrees F). Protect from light. Keep container tightly closed. Throw away any unused medication after the expiration date. NOTE: This sheet is a summary. It may not cover all possible information. If you have questions about this medicine, talk to your doctor, pharmacist, or health care provider.  2024 Elsevier/Gold Standard (2020-11-24 00:00:00) Rimegepant Disintegrating Tablets What is this medication? RIMEGEPANT (ri ME je pant) prevents and treats migraines. It works by blocking a substance in the body that causes migraines. This medicine may be used for other purposes; ask your health care provider or pharmacist if you have questions. COMMON BRAND  NAME(S): NURTEC ODT What should I tell my care team before I take this medication? They need to know if you have any of these conditions: Kidney disease Liver disease An unusual or allergic reaction to rimegepant, other medications, foods, dyes, or preservatives Pregnant or trying to get pregnant Breast-feeding How should I use this medication? Take this medication by mouth. Take it as directed on the prescription label. Leave the tablet in the sealed pack until you are ready to take it. With dry hands, open the pack and gently remove the tablet. If the tablet breaks or crumbles, throw it away. Use a new tablet. Place the tablet in the mouth and allow it to dissolve. Then, swallow it. Do not cut, crush, or chew this medication. You do not need water to take this medication. Talk to your care team about the use of this medication in children. Special care may be needed. Overdosage: If you think you have taken too much of this medicine contact a poison control center or emergency room at once. NOTE: This medicine is only for you. Do not share this medicine with others. What if I miss a dose? This does not apply. This medication is  not for regular use. What may interact with this medication? Certain medications for fungal infections, such as fluconazole, itraconazole Rifampin This list may not describe all possible interactions. Give your health care provider a list of all the medicines, herbs, non-prescription drugs, or dietary supplements you use. Also tell them if you smoke, drink alcohol, or use illegal drugs. Some items may interact with your medicine. What should I watch for while using this medication? Visit your care team for regular checks on your progress. Tell your care team if your symptoms do not start to get better or if they get worse. What side effects may I notice from receiving this medication? Side effects that you should report to your care team as soon as possible: Allergic  reactions--skin rash, itching, hives, swelling of the face, lips, tongue, or throat Side effects that usually do not require medical attention (report to your care team if they continue or are bothersome): Nausea Stomach pain This list may not describe all possible side effects. Call your doctor for medical advice about side effects. You may report side effects to FDA at 1-800-FDA-1088. Where should I keep my medication? Keep out of the reach of children and pets. Store at room temperature between 20 and 25 degrees C (68 and 77 degrees F). Get rid of any unused medication after the expiration date. To get rid of medications that are no longer needed or have expired: Take the medication to a medication take-back program. Check with your pharmacy or law enforcement to find a location. If you cannot return the medication, check the label or package insert to see if the medication should be thrown out in the garbage or flushed down the toilet. If you are not sure, ask your care team. If it is safe to put it in the trash, take the medication out of the container. Mix the medication with cat litter, dirt, coffee grounds, or other unwanted substance. Seal the mixture in a bag or container. Put it in the trash. NOTE: This sheet is a summary. It may not cover all possible information. If you have questions about this medicine, talk to your doctor, pharmacist, or health care provider.  2024 Elsevier/Gold Standard (2021-10-17 00:00:00)

## 2023-02-15 LAB — HEMOGLOBIN A1C
Est. average glucose Bld gHb Est-mCnc: 117 mg/dL
Hgb A1c MFr Bld: 5.7 % — ABNORMAL HIGH (ref 4.8–5.6)

## 2023-02-15 LAB — SEDIMENTATION RATE: Sed Rate: 7 mm/hr (ref 0–40)

## 2023-02-15 LAB — COMPREHENSIVE METABOLIC PANEL
ALT: 11 IU/L (ref 0–32)
AST: 14 IU/L (ref 0–40)
Albumin/Globulin Ratio: 1.6 (ref 1.2–2.2)
Albumin: 4.4 g/dL (ref 3.9–4.9)
Alkaline Phosphatase: 75 IU/L (ref 44–121)
BUN/Creatinine Ratio: 14 (ref 12–28)
BUN: 10 mg/dL (ref 8–27)
Bilirubin Total: 0.5 mg/dL (ref 0.0–1.2)
CO2: 23 mmol/L (ref 20–29)
Calcium: 9.6 mg/dL (ref 8.7–10.3)
Chloride: 107 mmol/L — ABNORMAL HIGH (ref 96–106)
Creatinine, Ser: 0.69 mg/dL (ref 0.57–1.00)
Globulin, Total: 2.7 g/dL (ref 1.5–4.5)
Glucose: 79 mg/dL (ref 70–99)
Potassium: 3.7 mmol/L (ref 3.5–5.2)
Sodium: 144 mmol/L (ref 134–144)
Total Protein: 7.1 g/dL (ref 6.0–8.5)
eGFR: 94 mL/min/{1.73_m2} (ref >59)

## 2023-02-15 LAB — CBC WITH DIFFERENTIAL/PLATELET
Basophils Absolute: 0.1 10*3/uL (ref 0.0–0.2)
Basos: 1 %
EOS (ABSOLUTE): 0.2 10*3/uL (ref 0.0–0.4)
Eos: 3 %
Hematocrit: 38.9 % (ref 34.0–46.6)
Hemoglobin: 11.9 g/dL (ref 11.1–15.9)
Immature Grans (Abs): 0 10*3/uL (ref 0.0–0.1)
Immature Granulocytes: 0 %
Lymphocytes Absolute: 1.8 10*3/uL (ref 0.7–3.1)
Lymphs: 34 %
MCH: 22.1 pg — ABNORMAL LOW (ref 26.6–33.0)
MCHC: 30.6 g/dL — ABNORMAL LOW (ref 31.5–35.7)
MCV: 72 fL — ABNORMAL LOW (ref 79–97)
Monocytes Absolute: 0.5 10*3/uL (ref 0.1–0.9)
Monocytes: 10 %
Neutrophils Absolute: 2.7 10*3/uL (ref 1.4–7.0)
Neutrophils: 52 %
Platelets: 413 10*3/uL (ref 150–450)
RBC: 5.38 x10E6/uL — ABNORMAL HIGH (ref 3.77–5.28)
RDW: 16 % — ABNORMAL HIGH (ref 11.7–15.4)
WBC: 5.3 10*3/uL (ref 3.4–10.8)

## 2023-02-15 LAB — TSH RFX ON ABNORMAL TO FREE T4: TSH: 2.07 u[IU]/mL (ref 0.450–4.500)

## 2023-02-15 LAB — C-REACTIVE PROTEIN: CRP: 4 mg/L (ref 0–10)

## 2023-02-16 ENCOUNTER — Encounter: Payer: Self-pay | Admitting: Neurology

## 2023-02-16 ENCOUNTER — Telehealth: Payer: Self-pay | Admitting: Neurology

## 2023-02-16 NOTE — Telephone Encounter (Signed)
I called patient stated her esr/crp normal she does not have [facility] or Artemio Aly, MD she can finish the steroid taper or stop. thanks

## 2023-02-17 ENCOUNTER — Telehealth: Payer: Self-pay | Admitting: Neurology

## 2023-02-17 NOTE — Telephone Encounter (Signed)
Just an fyi

## 2023-02-17 NOTE — Telephone Encounter (Signed)
Received message from [facility] regarding scheduling:  Patient opted to wait until 03-05-23, she states she feels more in the neck area and is going to a specialist on the 20th and would prefer to wait until after this appt to decide.

## 2023-02-17 NOTE — Telephone Encounter (Signed)
MR brain w/wo contrast sent to GI as urgent, no auth required via [facility] Medicare portal.

## 2023-02-17 NOTE — Telephone Encounter (Signed)
Thanks

## 2023-02-19 ENCOUNTER — Ambulatory Visit
Admission: RE | Admit: 2023-02-19 | Discharge: 2023-02-19 | Disposition: A | Discharge status: Home / Self Care | Payer: Medicare Other | Source: Ambulatory Visit | Attending: Neurology | Admitting: Neurology

## 2023-02-19 DIAGNOSIS — G441 Vascular headache, not elsewhere classified: Secondary | ICD-10-CM | POA: Diagnosis not present

## 2023-02-19 MED ORDER — GADOPICLENOL 0.5 MMOL/ML IV SOLN
7.0000 mL | Freq: Once | INTRAVENOUS | Status: AC | PRN
  Administered 2023-02-19: 7 mL via INTRAVENOUS

## 2023-02-21 ENCOUNTER — Telehealth: Payer: Self-pay

## 2023-02-21 ENCOUNTER — Other Ambulatory Visit (HOSPITAL_COMMUNITY): Payer: Self-pay

## 2023-02-21 NOTE — Telephone Encounter (Signed)
   I received a PA request and in the chart the DX code associated with this med is R51.9, Temporal headache. Please advise.

## 2023-02-24 NOTE — Telephone Encounter (Signed)
Pharmacy Patient Advocate Encounter   Received notification from [employer] that prior authorization for Nurtec 75MG  dispersible tablets is required/requested.   PA submitted to [facility] via CoverMyMeds Key or (Medicaid) confirmation # [id number] Status is pending

## 2023-02-24 NOTE — Telephone Encounter (Signed)
The other dx codes from the office visit from which nurtec was prescribed:

## 2023-02-25 ENCOUNTER — Telehealth: Payer: Self-pay

## 2023-02-25 ENCOUNTER — Other Ambulatory Visit (HOSPITAL_COMMUNITY): Payer: Self-pay

## 2023-02-25 ENCOUNTER — Telehealth: Payer: Self-pay | Admitting: Neurology

## 2023-02-25 DIAGNOSIS — M799 Soft tissue disorder, unspecified: Secondary | ICD-10-CM

## 2023-02-25 NOTE — Telephone Encounter (Signed)
Received [employer] [facility] needing more information.  Asking why she cannot take triptan (preferred).  I do not see that she has tried.  (One triptan).

## 2023-02-25 NOTE — Telephone Encounter (Signed)
-----   Message from Anson Fret, MD sent at 02/24/2023  4:27 PM EDT ----- Patient has a normal for age brain but she has some skull thickening that could be seen in Paget's disease, a disease. 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 Normally, there is a process in which your bones break down and then regrow. In Paget's disease, this process is abnormal. There is excessive breakdown and regrowth of bone. Because the bones regrow too quickly, they are bigger and softer than normal. We can send her to endocrinology if she likes for an evaluation it is an endocrinology disease.please discuss and let me know.

## 2023-02-25 NOTE — Telephone Encounter (Signed)
I spoke with the patient and provided her results of the MRI. She verbalized understanding and expressed appreciation for the call. She is agreeable to an endocrinology referral.

## 2023-02-25 NOTE — Telephone Encounter (Signed)
Referral faxed to [facility]: Phone: [phone] Fax: [phone] 

## 2023-02-25 NOTE — Telephone Encounter (Signed)
[employer] Prior Authorization Sandra Carr) would like a call back to discuss the authorization for Sandra Carr. Also is time sensitive. Authorization will expire 02/27/23 at 6am if no response is received. Can contact at [phone].

## 2023-02-25 NOTE — Telephone Encounter (Signed)
[facility] back and answered their questions of has PT taken any Triptans or has a Contraindication to Triptans-I answered No because per chart notes Triptans are not mentioned-also reviewed her med list in Epic at the discontinued meds looking for evidence of past Triptans use. The other question was is there a medical reason that the PT could not take Triptans and found no documentation stating PT should not take Triptans.   Call Reference: [phone]

## 2023-02-26 NOTE — Telephone Encounter (Signed)
I gave her 8 nurtec and a steroid taper to try and break the cycle of her headache because she had never had migraines or headaches before while we performed a workup  Since the workup is negative she should follow up for migraine treatment if she is still having migraines. Tell her she will have to try several other medications before she can get nurtec. It can be a video with an NP. And we can start a triptan and possibly a preventative. If you schedule with an NP let me know who so I can speak with them. First available or whenever patient wants to be seen.

## 2023-02-27 NOTE — Telephone Encounter (Signed)
Pt returned call. Made appt with AL,NP Monday 03-03-2023 30 min for migraines.  Discuss triptan, possibly preventative. Offered VV but she rather come in.

## 2023-02-27 NOTE — Telephone Encounter (Signed)
I called pt, she relayed that medications did help, but she continues to have some headaches, not as bad as they were. I told her nurtec denied due to not having tried any triptans.  She has not tried any triptans.  She is willing to see if other medications would help.  She will call back to schedule appt with NP to discuss med management.  (She was driving and could not make appt).  If she calls back ok to make appt with NP (any).

## 2023-03-03 ENCOUNTER — Encounter: Payer: Self-pay | Admitting: Family Medicine

## 2023-03-03 ENCOUNTER — Ambulatory Visit: Payer: Medicare Other | Admitting: Family Medicine

## 2023-03-03 VITALS — BP 133/78 | HR 88 | Ht 65.0 in | Wt 139.0 lb

## 2023-03-03 DIAGNOSIS — G44209 Tension-type headache, unspecified, not intractable: Secondary | ICD-10-CM

## 2023-03-03 DIAGNOSIS — G43709 Chronic migraine without aura, not intractable, without status migrainosus: Secondary | ICD-10-CM | POA: Diagnosis not present

## 2023-03-03 MED ORDER — RIZATRIPTAN BENZOATE 10 MG PO TBDP: 10.0000 mg | ORAL_TABLET | ORAL | 11 refills | Status: AC | PRN

## 2023-03-03 NOTE — Patient Instructions (Signed)
Below is our plan:  We will continue to monitor. You can try over the counter supplements for prevention. I will prescribe rizatriptan for abortive therapy. Please take 1 tablet at onset of headache. May take 1 additional tablet in 2 hours if needed. Do not take more than 2 tablets in 24 hours or more than 10 in a month.   Please make sure you are staying well hydrated. I recommend 50-60 ounces daily. Well balanced diet and regular exercise encouraged. Consistent sleep schedule with 6-8 hours recommended.   Please continue follow up with care team as directed.   Follow up with me in 6 months   You may receive a survey regarding today's visit. I encourage you to leave honest feed back as I do use this information to improve patient care. Thank you for seeing me today!   GENERAL HEADACHE INFORMATION:   Natural supplements: Magnesium Oxide or Magnesium Glycinate 500 mg at bed (up to 800 mg daily) Coenzyme Q10 300 mg in AM Vitamin B2- 200 mg twice a day   Add 1 supplement at a time since even natural supplements can have undesirable side effects. You can sometimes buy supplements cheaper (especially Coenzyme Q10) at www.WebmailGuide.co.za or at [facility].  Migraine with aura: There is increased risk for stroke in women with migraine with aura and a contraindication for the combined contraceptive pill for use by women who have migraine with aura. The risk for women with migraine without aura is lower. However other risk factors like smoking are far more likely to increase stroke risk than migraine. There is a recommendation for no smoking and for the use of OCPs without estrogen such as progestogen only pills particularly for women with migraine with aura.Marland Kitchen People who have migraine headaches with auras may be 3 times more likely to have a stroke caused by a blood clot, compared to migraine patients who don't see auras. Women who take hormone-replacement therapy may be 30 percent more likely to suffer a  clot-based stroke than women not taking medication containing estrogen. Other risk factors like smoking and high blood pressure may be  much more important.    Vitamins and herbs that show potential:   Magnesium: Magnesium (250 mg twice a day or 500 mg at bed) has a relaxant effect on smooth muscles such as blood vessels. Individuals suffering from frequent or daily headache usually have low magnesium levels which can be increase with daily supplementation of 400-750 mg. Three trials found 40-90% average headache reduction  when used as a preventative. Magnesium may help with headaches are aura, the best evidence for magnesium is for migraine with aura is its thought to stop the cortical spreading depression we believe is the pathophysiology of migraine aura.Magnesium also demonstrated the benefit in menstrually related migraine.  Magnesium is part of the messenger system in the serotonin cascade and it is a good muscle relaxant.  It is also useful for constipation which can be a side effect of other medications used to treat migraine. Good sources include nuts, whole grains, and tomatoes. Side Effects: loose stool/diarrhea  Riboflavin (vitamin B 2) 200 mg twice a day. This vitamin assists nerve cells in the production of ATP a principal energy storing molecule.  It is necessary for many chemical reactions in the body.  There have been at least 3 clinical trials of riboflavin using 400 mg per day all of which suggested that migraine frequency can be decreased.  All 3 trials showed significant improvement in over half  of migraine sufferers.  The supplement is found in bread, cereal, milk, meat, and poultry.  Most Americans get more riboflavin than the recommended daily allowance, however riboflavin deficiency is not necessary for the supplements to help prevent headache. Side effects: energizing, green urine   Coenzyme Q10: This is present in almost all cells in the body and is critical component for the  conversion of energy.  Recent studies have shown that a nutritional supplement of CoQ10 can reduce the frequency of migraine attacks by improving the energy production of cells as with riboflavin.  Doses of 150 mg twice a day have been shown to be effective.   Melatonin: Increasing evidence shows correlation between melatonin secretion and headache conditions.  Melatonin supplementation has decreased headache intensity and duration.  It is widely used as a sleep aid.  Sleep is natures way of dealing with migraine.  A dose of 3 mg is recommended to start for headaches including cluster headache. Higher doses up to 15 mg has been reviewed for use in Cluster headache and have been used. The rationale behind using melatonin for cluster is that many theories regarding the cause of Cluster headache center around the disruption of the normal circadian rhythm in the brain.  This helps restore the normal circadian rhythm.   HEADACHE DIET: Foods and beverages which may trigger migraine Note that only 20% of headache patients are food sensitive. You will know if you are food sensitive if you get a headache consistently 20 minutes to 2 hours after eating a certain food. Only cut out a food if it causes headaches, otherwise you might remove foods you enjoy! What matters most for diet is to eat a well balanced healthy diet full of vegetables and low fat protein, and to not miss meals.   Chocolate, other sweets ALL cheeses except cottage and cream cheese Dairy products, yogurt, sour cream, ice cream Liver Meat extracts (Bovril, Marmite, meat tenderizers) Meats or fish which have undergone aging, fermenting, pickling or smoking. These include: Hotdogs,salami,Lox,sausage, mortadellas,smoked salmon, pepperoni, Pickled herring Pods of broad bean (English beans, Chinese pea pods, [place] (fava) beans, lima and navy beans Ripe avocado, ripe banana Yeast extracts or active yeast preparations such as Brewer's or  Fleishman's (commercial bakes goods are permitted) Tomato based foods, pizza (lasagna, etc.)   MSG (monosodium glutamate) is disguised as many things; look for these common aliases: Monopotassium glutamate Autolysed yeast Hydrolysed protein Sodium caseinate "flavorings" "all natural preservatives" Nutrasweet   Avoid all other foods that convincingly provoke headaches.   Resources: The Dizzy [place]air Laundry Your Headache Diet, migrainestrong.com  https://zamora-andrews.com/   Caffeine and Migraine For patients that have migraine, caffeine intake more than 3 days per week can lead to dependency and increased migraine frequency. I would recommend cutting back on your caffeine intake as best you can. The recommended amount of caffeine is 200-300 mg daily, although migraine patients may experience dependency at even lower doses. While you may notice an increase in headache temporarily, cutting back will be helpful for headaches in the long run. For more information on caffeine and migraine, visit: https://americanmigrainefoundation.org/resource-library/caffeine-and-migraine/   Headache Prevention Strategies:   1. Maintain a headache diary; learn to identify and avoid triggers.  - This can be a simple note where you log when you had a headache, associated symptoms, and medications used - There are several smartphone apps developed to help track migraines: Migraine Buddy, Migraine Monitor, Curelator N1-Headache App   Common triggers include: Emotional triggers: Emotional/Upset family or friends  Emotional/Upset occupation Business reversal/success Anticipation anxiety Crisis-serious Post-crisis periodNew job/position   Physical triggers: Vacation Day Weekend Strenuous Exercise High Altitude Location New Move Menstrual Day Physical Illness Oversleep/Not enough sleep Weather changes Light: Photophobia or light sesnitivity treatment involves  a balance between desensitization and reduction in overly strong input. Use dark polarized glasses outside, but not inside. Avoid bright or fluorescent light, but do not dim environment to the point that going into a normally lit room hurts. Consider FL-41 tint lenses, which reduce the most irritating wavelengths without blocking too much light.  These can be obtained at axonoptics.com or theraspecs.com Foods: see list above.   2. Limit use of acute treatments (over-the-counter medications, triptans, etc.) to no more than 2 days per week or 10 days per month to prevent medication overuse headache (rebound headache).     3. Follow a regular schedule (including weekends and holidays): Don't skip meals. Eat a balanced diet. 8 hours of sleep nightly. Minimize stress. Exercise 30 minutes per day. Being overweight is associated with a 5 times increased risk of chronic migraine. Keep well hydrated and drink 6-8 glasses of water per day.   4. Initiate non-pharmacologic measures at the earliest onset of your headache. Rest and quiet environment. Relax and reduce stress. Breathe2Relax is a free app that can instruct you on    some simple relaxtion and breathing techniques. Http://Dawnbuse.com is a    free website that provides teaching videos on relaxation.  Also, there are  many apps that   can be downloaded for "mindful" relaxation.  An app called YOGA NIDRA will help walk you through mindfulness. Another app called Calm can be downloaded to give you a structured mindfulness guide with daily reminders and skill development. Headspace for guided meditation Mindfulness Based Stress Reduction Online Course: www.palousemindfulness.com Cold compresses.   5. Don't wait!! Take the maximum allowable dosage of prescribed medication at the first sign of migraine.   6. Compliance:  Take prescribed medication regularly as directed and at the first sign of a migraine.   7. Communicate:  Call your physician when  problems arise, especially if your headaches change, increase in frequency/severity, or become associated with neurological symptoms (weakness, numbness, slurred speech, etc.). Proceed to emergency room if you experience new or worsening symptoms or symptoms do not resolve, if you have new neurologic symptoms or if headache is severe, or for any concerning symptom.   8. Headache/pain management therapies: Consider various complementary methods, including medication, behavioral therapy, psychological counselling, biofeedback, massage therapy, acupuncture, dry needling, and other modalities.  Such measures may reduce the need for medications. Counseling for pain management, where patients learn to function and ignore/minimize their pain, seems to work very well.   9. Recommend changing family's attention and focus away from patient's headaches. Instead, emphasize daily activities. If first question of day is 'How are your headaches/Do you have a headache today?', then patient will constantly think about headaches, thus making them worse. Goal is to re-direct attention away from headaches, toward daily activities and other distractions.   10. Helpful Websites: www.AmericanHeadacheSociety.org PatentHood.ch www.headaches.org TightMarket.nl www.achenet.org

## 2023-03-03 NOTE — Progress Notes (Signed)
Chief Complaint  Patient presents with   Follow-up    Rm EMG/NCV 2. Accompanied by mom. Headaches have improved. Discuss referral for endocrinology regarding Paget's disease.    HISTORY OF PRESENT ILLNESS:  03/03/23 ALL:  Sandra Carr is a 67 y.o. female here today for follow up for headaches. She was seen in consult with Dr Lucia Gaskins 02/14/2023 for new onset temporal headaches. Labs and MRI unremarkable. She was treated with Nurtec and prednisone with some benefit but headaches continue. She reports a daily tension style headache. Some migrainous symptoms but unclear how often these occur. She feels most are bifrontal/maxillary tension headaches. She does have seasonal allergies treated with Singulair and Flonase. History of asthma. She tries to drink at least 50-60 ounces of water daily.   HISTORY (copied from Dr Trevor Mace previous note)  HPI:  Sandra Carr is a 67 y.o. female here as requested by Jackelyn Poling, DO for headaches. Has had a headache since April. Never a hx of migraines. has Tachycardia; Low back pain; Pain in right knee; Subjective tinnitus, bilateral; Asthma exacerbation; Anemia; Adjustment disorder with mixed anxiety and depressed mood; Cervical radiculopathy; Cervical spondylosis; Chronic atrophic gastritis; Disorder of soft tissue; Gastroesophageal reflux disease without esophagitis; Hearing loss; Hypercalcemia; Hyperglycemia; Hypertension; Insomnia; Iron deficiency anemia; Irritable bowel syndrome; Neck pain; Osteopenia; Overactive bladder; Pain in wrist; Palpitations; Paroxysmal cough; Prediabetes; Rash; Sciatica; Shoulder joint pain; Sleep disturbance; Vitamin D deficiency; Skin sensation disturbance; Allergic rhinitis; Asthma; Age-related osteoporosis without current pathological fracture; Primary hyperparathyroidism (HCC); and URI (upper respiratory infection) on their problem list.   Headaches started in April. No inciting events. He has been having more allergies.  Grabddaughter with migraines. Feels pulsing/throbbing, light and sound sensitivity, feel pulsating in both ears, light  and sound sensitivity, light makes it worse and hurts her eyes, she is having vision changes. Running a fever the last few days. Nausea. Mostly during the day. She feels bette rin the morning worse at stand up throughout the day, positional and when she moves around. Daily, worsening, moderate to severe. In the temples and forehead, a dar room helps, also in the neck tightness and neck pain , muscle aches and pains, no jaw pain. Taking ibuprofen, excedrin nothing is helping a lot. Started amox, steroid taper 60-10mg  which she has not taken yet, No other focal neurologic deficits, associated symptoms, inciting events or modifiable factors.   REVIEW OF SYSTEMS: Out of a complete 14 system review of symptoms, the patient complains only of the following symptoms, headaches, allergies, and all other reviewed systems are negative.   ALLERGIES: Allergies  Allergen Reactions   Other Hives and Anaphylaxis   Peanut-Containing Drug Products Shortness Of Breath   Contrast Media [Iodinated Contrast Media]    Iohexol      Code: HIVES, Desc: patient claims hive from iv contrast. she drank omnipaque laced water oral contrast before informing anyone of an allergy (no problem from the oral at this time). no record of any enhancable procedures done in our system., Onset Date: [id number]    Latex Swelling   Methocarbamol Swelling   Shellfish Allergy Other (See Comments)    Mouth and Throat Swelling.   Triamcinolone Other (See Comments)   Hydrochlorothiazide Rash     HOME MEDICATIONS: Outpatient Medications Prior to Visit  Medication Sig Dispense Refill   albuterol (PROVENTIL HFA;VENTOLIN HFA) 108 (90 BASE) MCG/ACT inhaler Inhale 2 puffs into the lungs every 6 (six) hours as needed for wheezing or shortness of breath (  wheezing).     albuterol (PROVENTIL) (2.5 MG/3ML) 0.083% nebulizer solution  Take 2.5 mg by nebulization every 6 (six) hours as needed for wheezing or shortness of breath (wheezing).     amLODipine (NORVASC) 5 MG tablet Take 5 mg by mouth daily.     benzonatate (TESSALON) 200 MG capsule Take 1 capsule (200 mg total) by mouth 3 (three) times daily as needed for cough. 30 capsule 1   cyclobenzaprine (FLEXERIL) 10 MG tablet Take 10 mg by mouth 3 (three) times daily.     EPINEPHrine 0.3 mg/0.3 mL IJ SOAJ injection Inject into the muscle as needed.     fluticasone (FLONASE) 50 MCG/ACT nasal spray Place 2 sprays into both nostrils daily. 18.2 mL 2   fluticasone-salmeterol (ADVAIR) 500-50 MCG/ACT AEPB INHALE 1 DOSE     meloxicam (MOBIC) 15 MG tablet Take 1 tablet (15 mg total) by mouth daily. 30 tablet 0   montelukast (SINGULAIR) 10 MG tablet TAKE 1 TABLET BY MOUTH AT BEDTIME 30 tablet 2   Rimegepant Sulfate (NURTEC) 75 MG TBDP Take 1 tablet (75 mg total) by mouth daily as needed. For migraines. Take as close to onset of migraine as possible. One daily maximum. 8 tablet 0   sodium chloride (OCEAN) 0.65 % SOLN nasal spray Place 1 spray into both nostrils as needed for congestion.     vitamin B-12 (CYANOCOBALAMIN) 1000 MCG tablet Take 1 tablet (1,000 mcg total) by mouth daily. 30 tablet 0   zolpidem (AMBIEN) 10 MG tablet Take 10 mg by mouth at bedtime as needed for sleep.     pantoprazole (PROTONIX) 40 MG tablet Take 1 tablet (40 mg total) by mouth daily. 30 tablet 0   Potassium Chloride ER 20 MEQ TBCR Take 1 tablet by mouth daily.     No facility-administered medications prior to visit.     PAST MEDICAL HISTORY: Past Medical History:  Diagnosis Date   Asthma      PAST SURGICAL HISTORY: Past Surgical History:  Procedure Laterality Date   ABDOMINAL HYSTERECTOMY     FOOT SURGERY Left      FAMILY HISTORY: Family History  Problem Relation Age of Onset   Breast cancer Cousin    Hypertension Mother    Diabetes Mother    Hypertension Father    Heart failure Brother     Heart disease Brother      SOCIAL HISTORY: Social History   Socioeconomic History   Marital status: Divorced    Spouse name: Not on file   Number of children: 2   Years of education: Not on file   Highest education level: Not on file  Occupational History   Not on file  Tobacco Use   Smoking status: Former    Packs/day: 0.25    Years: 20.00    Additional pack years: 0.00    Total pack years: 5.00    Types: Cigarettes    Quit date: 1998    Years since quitting: 26.4   Smokeless tobacco: Never  Vaping Use   Vaping Use: Never used  Substance and Sexual Activity   Alcohol use: Yes    Comment: occ wine   Drug use: Never   Sexual activity: Not on file  Other Topics Concern   Not on file  Social History Narrative   Not on file   Social Determinants of Health   Financial Resource Strain: Not on file  Food Insecurity: Not on file  Transportation Needs: Not on file  Physical  Activity: Not on file  Stress: Not on file  Social Connections: Not on file  Intimate Partner Violence: Not on file     PHYSICAL EXAM  Vitals:   03/03/23 0912  BP: 133/78  Pulse: 88  Weight: 139 lb (63 kg)  Height: 5\' 5"  (1.651 m)   Body mass index is 23.13 kg/m.  Generalized: Well developed, in no acute distress  Cardiology: normal rate and rhythm, no murmur auscultated  Respiratory: clear to auscultation bilaterally    Neurological examination  Mentation: Alert oriented to time, place, history taking. Follows all commands speech and language fluent Cranial nerve II-XII: Pupils were equal round reactive to light. Extraocular movements were full, visual field were full on confrontational test. Facial sensation and strength were normal. Head turning and shoulder shrug  were normal and symmetric. Motor: The motor testing reveals 5 over 5 strength of all 4 extremities. Good symmetric motor tone is noted throughout.  Gait and station: Gait is normal.    DIAGNOSTIC DATA (LABS, IMAGING,  TESTING) - I reviewed patient records, labs, notes, testing and imaging myself where available.  Lab Results  Component Value Date   WBC 5.3 02/14/2023   HGB 11.9 02/14/2023   HCT 38.9 02/14/2023   MCV 72 (L) 02/14/2023   PLT 413 02/14/2023      Component Value Date/Time   NA 144 02/14/2023 1049   K 3.7 02/14/2023 1049   CL 107 (H) 02/14/2023 1049   CO2 23 02/14/2023 1049   GLUCOSE 79 02/14/2023 1049   GLUCOSE 87 01/02/2023 1920   BUN 10 02/14/2023 1049   CREATININE 0.69 02/14/2023 1049   CALCIUM 9.6 02/14/2023 1049   PROT 7.1 02/14/2023 1049   ALBUMIN 4.4 02/14/2023 1049   AST 14 02/14/2023 1049   ALT 11 02/14/2023 1049   ALKPHOS 75 02/14/2023 1049   BILITOT 0.5 02/14/2023 1049   GFRNONAA >60 01/02/2023 1920   GFRAA >60 09/20/2019 0408   No results found for: "CHOL", "HDL", "LDLCALC", "LDLDIRECT", "TRIG", "CHOLHDL" Lab Results  Component Value Date   HGBA1C 5.7 (H) 02/14/2023   Lab Results  Component Value Date   VITAMINB12 147 (L) 02/07/2021   Lab Results  Component Value Date   TSH 2.070 02/14/2023        No data to display               No data to display           ASSESSMENT AND PLAN  66 y.o. year old female  has a past medical history of Asthma. here with    Chronic migraine w/o aura w/o status migrainosus, not intractable  Tension headache  Sandra Carr reports daily tension style headaches with occasional migrainous features. We have discussed prevention medications. She is most comfortable trying OTC supplements. We will try rizatriptan for abortive therapy. Education was provided on dosing and possible side effects. Healthy lifestyle habits encouraged. She will follow up with PCP as directed. She will return to see me in 6 months, sooner if needed. She verbalizes understanding and agreement with this plan.   No orders of the defined types were placed in this encounter.    Meds ordered this encounter  Medications   rizatriptan  (MAXALT-MLT) 10 MG disintegrating tablet    Sig: Take 1 tablet (10 mg total) by mouth as needed for migraine. May repeat in 2 hours if needed    Dispense:  9 tablet    Refill:  11  Order Specific Question:   Supervising Provider    Answer:   Anson Fret [[id number]]     Shawnie Dapper, MSN, FNP-C 03/03/2023, 9:57 AM  [facility] [address] [place], [place] [address] [phone]

## 2023-03-05 ENCOUNTER — Other Ambulatory Visit: Payer: Medicare Other

## 2023-04-01 ENCOUNTER — Other Ambulatory Visit: Payer: Self-pay | Admitting: Family Medicine

## 2023-04-01 ENCOUNTER — Ambulatory Visit
Admission: RE | Admit: 2023-04-01 | Discharge: 2023-04-01 | Disposition: A | Discharge status: Home / Self Care | Payer: Medicare Other | Source: Ambulatory Visit | Attending: Family Medicine | Admitting: Family Medicine

## 2023-04-01 DIAGNOSIS — M542 Cervicalgia: Secondary | ICD-10-CM

## 2023-04-21 ENCOUNTER — Other Ambulatory Visit: Payer: Self-pay | Admitting: Obstetrics and Gynecology

## 2023-04-21 DIAGNOSIS — Z1231 Encounter for screening mammogram for malignant neoplasm of breast: Secondary | ICD-10-CM

## 2023-04-25 ENCOUNTER — Other Ambulatory Visit: Payer: Self-pay | Admitting: Nurse Practitioner

## 2023-04-25 DIAGNOSIS — J4551 Severe persistent asthma with (acute) exacerbation: Secondary | ICD-10-CM

## 2023-05-01 ENCOUNTER — Ambulatory Visit
Admission: RE | Admit: 2023-05-01 | Discharge: 2023-05-01 | Disposition: A | Discharge status: Home / Self Care | Payer: Medicare Other | Source: Ambulatory Visit | Attending: Obstetrics and Gynecology | Admitting: Obstetrics and Gynecology

## 2023-05-01 DIAGNOSIS — Z1231 Encounter for screening mammogram for malignant neoplasm of breast: Secondary | ICD-10-CM

## 2023-07-02 ENCOUNTER — Other Ambulatory Visit: Payer: Self-pay | Admitting: Nurse Practitioner

## 2023-07-02 DIAGNOSIS — J4551 Severe persistent asthma with (acute) exacerbation: Secondary | ICD-10-CM

## 2023-07-27 ENCOUNTER — Other Ambulatory Visit: Payer: Self-pay | Admitting: Nurse Practitioner

## 2023-07-27 DIAGNOSIS — J4551 Severe persistent asthma with (acute) exacerbation: Secondary | ICD-10-CM

## 2023-08-20 ENCOUNTER — Ambulatory Visit: Payer: Medicare Other | Admitting: Podiatry

## 2023-08-20 DIAGNOSIS — B351 Tinea unguium: Secondary | ICD-10-CM

## 2023-08-20 DIAGNOSIS — M79674 Pain in right toe(s): Secondary | ICD-10-CM | POA: Diagnosis not present

## 2023-08-20 DIAGNOSIS — M79675 Pain in left toe(s): Secondary | ICD-10-CM | POA: Diagnosis not present

## 2023-08-20 NOTE — Progress Notes (Signed)
  Subjective:  Patient ID: Sandra Carr, female    DOB: 17-Jul-1956,  MRN: [id number]  Chief Complaint  Patient presents with   Foot Pain    She reports she thinks she has ingrown toe nails on both big toes of feet. She has noticed some pain in both feet under toe area.    67 y.o. female returns for the above complaint.  Patient presents with thickened elongated dystrophic mycotic toenails x 10 mild pain on palpation worse with ambulation is with pressure patient would like for me debride down she is able to resolve denies any other acute complaints discussed on her toe.  Objective:  There were no vitals filed for this visit. Podiatric Exam: Vascular: dorsalis pedis and posterior tibial pulses are palpable bilateral. Capillary return is immediate. Temperature gradient is WNL. Skin turgor WNL  Sensorium: Normal Semmes Weinstein monofilament test. Normal tactile sensation bilaterally. Nail Exam: Pt has thick disfigured discolored nails with subungual debris noted bilateral entire nail hallux through fifth toenails.  Pain on palpation to the nails. Ulcer Exam: There is no evidence of ulcer or pre-ulcerative changes or infection. Orthopedic Exam: Muscle tone and strength are WNL. No limitations in general ROM. No crepitus or effusions noted.  Skin: No Porokeratosis. No infection or ulcers    Assessment & Plan:   1. Pain due to onychomycosis of toenails of both feet     Patient was evaluated and treated and all questions answered.  Onychomycosis with pain  -Nails palliatively debrided as below. -Educated on self-care  Procedure: Nail Debridement Rationale: pain  Type of Debridement: manual, sharp debridement. Instrumentation: Nail nipper, rotary burr. Number of Nails: 10  Procedures and Treatment: Consent by patient was obtained for treatment procedures. The patient understood the discussion of treatment and procedures well. All questions were answered thoroughly reviewed.  Debridement of mycotic and hypertrophic toenails, 1 through 5 bilateral and clearing of subungual debris. No ulceration, no infection noted.  Return Visit-Office Procedure: Patient instructed to return to the office for a follow up visit 3 months for continued evaluation and treatment.  Nicholes Rough, DPM    No follow-ups on file.

## 2023-09-23 ENCOUNTER — Ambulatory Visit: Payer: Medicare Other | Admitting: Family Medicine

## 2023-11-14 ENCOUNTER — Encounter: Payer: Self-pay | Admitting: Internal Medicine

## 2024-01-19 ENCOUNTER — Emergency Department (HOSPITAL_COMMUNITY)

## 2024-01-19 ENCOUNTER — Emergency Department (HOSPITAL_COMMUNITY)
Admission: EM | Admit: 2024-01-19 | Discharge: 2024-01-19 | Disposition: A | Discharge status: Home / Self Care | Attending: Student | Admitting: Student

## 2024-01-19 ENCOUNTER — Other Ambulatory Visit: Payer: Self-pay

## 2024-01-19 DIAGNOSIS — Z7951 Long term (current) use of inhaled steroids: Secondary | ICD-10-CM | POA: Insufficient documentation

## 2024-01-19 DIAGNOSIS — E876 Hypokalemia: Secondary | ICD-10-CM | POA: Diagnosis not present

## 2024-01-19 DIAGNOSIS — M5431 Sciatica, right side: Secondary | ICD-10-CM | POA: Insufficient documentation

## 2024-01-19 DIAGNOSIS — I10 Essential (primary) hypertension: Secondary | ICD-10-CM | POA: Diagnosis not present

## 2024-01-19 DIAGNOSIS — Z91010 Allergy to peanuts: Secondary | ICD-10-CM | POA: Diagnosis not present

## 2024-01-19 DIAGNOSIS — D72829 Elevated white blood cell count, unspecified: Secondary | ICD-10-CM | POA: Diagnosis not present

## 2024-01-19 DIAGNOSIS — J45909 Unspecified asthma, uncomplicated: Secondary | ICD-10-CM | POA: Diagnosis not present

## 2024-01-19 DIAGNOSIS — Z79899 Other long term (current) drug therapy: Secondary | ICD-10-CM | POA: Insufficient documentation

## 2024-01-19 DIAGNOSIS — Z91040 Latex allergy status: Secondary | ICD-10-CM | POA: Diagnosis not present

## 2024-01-19 DIAGNOSIS — M549 Dorsalgia, unspecified: Secondary | ICD-10-CM | POA: Diagnosis present

## 2024-01-19 LAB — BASIC METABOLIC PANEL WITH GFR
Anion gap: 10 (ref 5–15)
BUN: 11 mg/dL (ref 8–23)
CO2: 22 mmol/L (ref 22–32)
Calcium: 9.4 mg/dL (ref 8.9–10.3)
Chloride: 106 mmol/L (ref 98–111)
Creatinine, Ser: 0.7 mg/dL (ref 0.44–1.00)
GFR, Estimated: >60 mL/min (ref >60)
Glucose, Bld: 98 mg/dL (ref 70–99)
Potassium: 3.2 mmol/L — ABNORMAL LOW (ref 3.5–5.1)
Sodium: 138 mmol/L (ref 135–145)

## 2024-01-19 LAB — URINALYSIS, ROUTINE W REFLEX MICROSCOPIC
Bilirubin Urine: NEGATIVE
Glucose, UA: NEGATIVE mg/dL
Hgb urine dipstick: NEGATIVE
Ketones, ur: NEGATIVE mg/dL
Leukocytes,Ua: NEGATIVE
Nitrite: NEGATIVE
Protein, ur: NEGATIVE mg/dL
Specific Gravity, Urine: 1.009 (ref 1.005–1.030)
pH: 9 — ABNORMAL HIGH (ref 5.0–8.0)

## 2024-01-19 LAB — CBC
HCT: 35.6 % — ABNORMAL LOW (ref 36.0–46.0)
Hemoglobin: 11.3 g/dL — ABNORMAL LOW (ref 12.0–15.0)
MCH: 22.1 pg — ABNORMAL LOW (ref 26.0–34.0)
MCHC: 31.7 g/dL (ref 30.0–36.0)
MCV: 69.5 fL — ABNORMAL LOW (ref 80.0–100.0)
Platelets: 413 10*3/uL — ABNORMAL HIGH (ref 150–400)
RBC: 5.12 MIL/uL — ABNORMAL HIGH (ref 3.87–5.11)
RDW: 17.7 % — ABNORMAL HIGH (ref 11.5–15.5)
WBC: 12 10*3/uL — ABNORMAL HIGH (ref 4.0–10.5)
nRBC: 0 % (ref 0.0–0.2)

## 2024-01-19 MED ORDER — KETOROLAC TROMETHAMINE 15 MG/ML IJ SOLN
15.0000 mg | Freq: Once | INTRAMUSCULAR | Status: AC
  Administered 2024-01-19: 15 mg via INTRAMUSCULAR
  Filled 2024-01-19: qty 1

## 2024-01-19 MED ORDER — NAPROXEN 375 MG PO TABS: 375.0000 mg | ORAL_TABLET | Freq: Two times a day (BID) | ORAL | 0 refills | Status: AC

## 2024-01-19 MED ORDER — LIDOCAINE 4 % EX PTCH: 1.0000 | MEDICATED_PATCH | Freq: Two times a day (BID) | CUTANEOUS | 0 refills | Status: AC

## 2024-01-19 MED ORDER — METHYLPREDNISOLONE 4 MG PO TBPK: ORAL_TABLET | ORAL | 0 refills | Status: AC

## 2024-01-19 MED ORDER — HYDROCODONE-ACETAMINOPHEN 5-325 MG PO TABS
1.0000 | ORAL_TABLET | Freq: Once | ORAL | Status: AC
  Administered 2024-01-19: 1 via ORAL
  Filled 2024-01-19: qty 1

## 2024-01-19 MED ORDER — OXYCODONE HCL 5 MG PO TABS
5.0000 mg | ORAL_TABLET | ORAL | 0 refills | Status: AC | PRN
Start: 2024-01-19 — End: ?

## 2024-01-19 MED ORDER — DEXAMETHASONE SODIUM PHOSPHATE 10 MG/ML IJ SOLN
10.0000 mg | Freq: Once | INTRAMUSCULAR | Status: AC
  Administered 2024-01-19: 10 mg via INTRAMUSCULAR
  Filled 2024-01-19: qty 1

## 2024-01-19 MED ORDER — ACETAMINOPHEN 500 MG PO TABS: 1000.0000 mg | ORAL_TABLET | Freq: Three times a day (TID) | ORAL | 0 refills | Status: AC

## 2024-01-19 MED ORDER — LIDOCAINE 5 % EX PTCH
1.0000 | MEDICATED_PATCH | CUTANEOUS | Status: DC
  Administered 2024-01-19: 1 via TRANSDERMAL
  Filled 2024-01-19: qty 1

## 2024-01-19 NOTE — Discharge Instructions (Addendum)
 For pain:  - Acetaminophen  1000 mg three times daily (every 8 hours) - Naproxen  2 times daily (every 12 hours) - oxycodone  for breakthrough pain only -Steroids as prescribed  -Lidoderm patches twice daily

## 2024-01-19 NOTE — ED Provider Notes (Signed)
 [place] EMERGENCY DEPARTMENT AT [facility] Provider Note  CSN: [id number] Arrival date & time: 01/19/24 2130  Chief Complaint(s) Spasms (Pt reports R sided spasms from back to feet and tingling in R hand starting Wednesday. Pt took a body pump class recently and has been feeling this way since )  HPI Sandra Carr is a 68 y.o. female with PMH osteoporosis, hyperparathyroidism, iron deficiency anemia, GERD who presents emerged part for back pain, leg pain.  Patient states that for the last 6 days she has had progressive worsening back pain that radiates down the right leg.  Having difficulty bearing weight due to the pain.  States that she did a workout class prior to starting the pain in the pain has become progressive.  Denies associated numbness, tingling, weakness or other neurologic complaints.  No saddle anesthesia or involuntary loss of bowel or bladder.  Denies chest pain, shortness of breath, Donnell pain, nausea, vomiting or other systemic symptoms.   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   Disorder of soft tissue 02/16/2021   Gastroesophageal reflux disease without esophagitis 02/16/2021   Hearing loss 02/16/2021   Hypercalcemia 02/16/2021   Hyperglycemia 02/16/2021   Hypertension 02/16/2021   Insomnia 02/16/2021   Iron deficiency anemia 02/16/2021   Irritable bowel syndrome 02/16/2021   Neck pain 02/16/2021   Osteopenia 02/16/2021   Overactive bladder 02/16/2021   Pain in wrist 02/16/2021   Palpitations  02/16/2021   Paroxysmal cough 02/16/2021   Prediabetes 02/16/2021   Rash 02/16/2021   Sciatica 02/16/2021   Shoulder joint pain 02/16/2021   Sleep disturbance 02/16/2021   Vitamin D deficiency 02/16/2021   Skin sensation disturbance 02/16/2021   Allergic rhinitis 02/16/2021   Anemia 02/07/2021   Asthma exacerbation 02/06/2021   Low back pain 11/29/2019   Pain in right knee 11/16/2019   Subjective tinnitus, bilateral 10/09/2017   Tachycardia 01/25/2016   Home Medication(s) Prior to Admission medications   Medication Sig Start Date End Date Taking? Authorizing Provider  albuterol  (PROVENTIL  HFA;VENTOLIN  HFA) 108 (90 BASE) MCG/ACT inhaler Inhale 2 puffs into the lungs every 6 (six) hours as needed for wheezing or shortness of breath (wheezing).    [provider]  albuterol  (PROVENTIL ) (2.5 MG/3ML) 0.083% nebulizer solution Take 2.5 mg by nebulization every 6 (six) hours as needed for wheezing or shortness of breath (wheezing).    [provider]  amLODipine  (NORVASC ) 5 MG tablet Take 5 mg by mouth daily.    [provider]  cyclobenzaprine (FLEXERIL) 10 MG tablet Take 10 mg by mouth 3 (three) times daily. 06/15/21   [provider]  EPINEPHrine 0.3 mg/0.3 mL IJ SOAJ injection Inject into the muscle as needed. 05/01/21   [provider]  fluticasone  (FLONASE ) 50 MCG/ACT nasal spray Place 2 sprays into both nostrils daily. 05/22/22   Cobb, Mariah Shines, NP  fluticasone -salmeterol (ADVAIR) 500-50 MCG/ACT AEPB INHALE 1 DOSE    [provider]  meloxicam  (MOBIC ) 15 MG tablet Take 1 tablet (15 mg total) by mouth daily. 06/18/21   Sikora, Rebecca, DPM  montelukast  (  SINGULAIR ) 10 MG tablet TAKE 1 TABLET BY MOUTH AT BEDTIME 07/31/23   Cobb, Mariah Shines, NP  Rimegepant Sulfate (NURTEC) 75 MG TBDP Take 1 tablet (75 mg total) by mouth daily as needed. For migraines. Take as close to onset of migraine as possible. One daily maximum. 02/14/23   Glory Larsen, MD  rizatriptan  (MAXALT -MLT) 10 MG disintegrating tablet Take 1 tablet (10 mg total) by mouth as needed for migraine. May repeat in 2 hours if needed 03/03/23   Lomax, Amy, NP  sodium chloride  (OCEAN) 0.65 % SOLN nasal spray Place 1 spray into both nostrils as needed for congestion.    [provider]  vitamin B-12 (CYANOCOBALAMIN ) 1000 MCG tablet Take 1 tablet (1,000 mcg total) by mouth daily. 02/08/21   Audria Leather, MD  zolpidem  (AMBIEN ) 10 MG tablet Take 10 mg by mouth at bedtime as needed for sleep. 01/31/21   [provider]                                                                                                                                    Past Surgical History Past Surgical History:  Procedure Laterality Date   ABDOMINAL HYSTERECTOMY     FOOT SURGERY Left    Family History Family History  Problem Relation Age of Onset   Breast cancer Cousin    Hypertension Mother    Diabetes Mother    Hypertension Father    Heart failure Brother    Heart disease Brother     Social History Social History   Tobacco Use   Smoking status: Former    Current packs/day: 0.00    Average packs/day: 0.3 packs/day for 20.0 years (5.0 ttl pk-yrs)    Types: Cigarettes    Start date: 12    Quit date: 1998    Years since quitting: 27.3   Smokeless tobacco: Never  Vaping Use   Vaping status: Never Used  Substance Use Topics   Alcohol use: Yes    Comment: occ wine   Drug use: Never   Allergies Other, Peanut-containing drug products, Contrast media [iodinated contrast media], Iohexol, Latex, Methocarbamol , Shellfish allergy , Triamcinolone, and Hydrochlorothiazide  Review of Systems Review of Systems  Musculoskeletal:  Positive for arthralgias, back pain and myalgias.    Physical Exam Vital Signs  I have reviewed the triage vital signs BP 131/76   Pulse 74   Temp 98.1 F (36.7 C) (Oral)   Resp 18   LMP 05/18/1993   SpO2 100%   Physical  Exam Vitals and nursing note reviewed.  Constitutional:      General: She is not in acute distress.    Appearance: She is well-developed.  HENT:     Head: Normocephalic and atraumatic.  Eyes:     Conjunctiva/sclera: Conjunctivae normal.  Cardiovascular:     Rate and Rhythm: Normal rate and regular rhythm.     Heart sounds: No  murmur heard. Pulmonary:     Effort: Pulmonary effort is normal. No respiratory distress.     Breath sounds: Normal breath sounds.  Abdominal:     Palpations: Abdomen is soft.     Tenderness: There is no abdominal tenderness.  Musculoskeletal:        General: Tenderness present. No swelling.     Cervical back: Neck supple.  Skin:    General: Skin is warm and dry.     Capillary Refill: Capillary refill takes less than 2 seconds.  Neurological:     Mental Status: She is alert.  Psychiatric:        Mood and Affect: Mood normal.     ED Results and Treatments Labs (all labs ordered are listed, but only abnormal results are displayed) Labs Reviewed  BASIC METABOLIC PANEL WITH GFR - Abnormal; Notable for the following components:      Result Value   Potassium 3.2 (*)    All other components within normal limits  CBC - Abnormal; Notable for the following components:   WBC 12.0 (*)    RBC 5.12 (*)    Hemoglobin 11.3 (*)    HCT 35.6 (*)    MCV 69.5 (*)    MCH 22.1 (*)    RDW 17.7 (*)    Platelets 413 (*)    All other components within normal limits  URINALYSIS, ROUTINE W REFLEX MICROSCOPIC - Abnormal; Notable for the following components:   pH 9.0 (*)    All other components within normal limits                                                                                                                          Radiology DG Hip Unilat With Pelvis 2-3 Views Right Result Date: 01/19/2024 CLINICAL DATA:  Hip pain EXAM: DG HIP (WITH OR WITHOUT PELVIS) 2-3V RIGHT COMPARISON:  None Available. FINDINGS: There is no evidence of hip fracture or  dislocation. There is no evidence of arthropathy or other focal bone abnormality. IMPRESSION: Negative. Electronically Signed   By: Fredrich Jefferson M.D.   On: 01/19/2024 10:38    Pertinent labs & imaging results that were available during my care of the patient were reviewed by me and considered in my medical decision making (see MDM for details).  Medications Ordered in ED Medications  ketorolac  (TORADOL ) 15 MG/ML injection 15 mg (has no administration in time range)  dexamethasone  (DECADRON ) injection 10 mg (has no administration in time range)  lidocaine (LIDODERM) 5 % 1 patch (has no administration in time range)  HYDROcodone -acetaminophen  (NORCO/VICODIN) 5-325 MG per tablet 1 tablet (1 tablet Oral Given 01/19/24 1330)  Procedures Procedures  (including critical care time)  Medical Decision Making / ED Course   This patient presents to the ED for concern of back pain, thigh pain, this involves an extensive number of treatment options, and is a complaint that carries with it a high risk of complications and morbidity.  The differential diagnosis includes fracture, sciatica, lateral femoral nerve compression, muscle strain  MDM: Patient seen in the emergency room for evaluation of back and leg pain.  Physical exam with tenderness over the L-spine but neurologic exam is unremarkable with no focal motor or sensory deficits.  No cranial nerve deficits.  Laboratory valuation with leukocytosis to 12.0, hemoglobin 11.3 with an MCV of 69.5, potassium 3.2, urinalysis without evidence of infection, hip x-ray is reassuringly negative.  Patient pain controlled with Decadron , Toradol  and Lidoderm and on reevaluation symptoms have significant improved.  Will treat with Medrol  Dosepak and symptomatic management in the outpatient setting.  Likely would benefit from seeing  neurosurgery in the outpatient setting for possible local injections in the area.  Neurologic exam is not consistent with cauda equina and thus advanced imaging was not pursued.  Presentation is consistent with sciatica today.  At this time she does not meet inpatient criteria for admission will be discharged with outpatient follow-up.   Additional history obtained: -Additional history obtained from family member -External records from outside source obtained and reviewed including: Chart review including previous notes, labs, imaging, consultation notes   Lab Tests: -I ordered, reviewed, and interpreted labs.   The pertinent results include:   Labs Reviewed  BASIC METABOLIC PANEL WITH GFR - Abnormal; Notable for the following components:      Result Value   Potassium 3.2 (*)    All other components within normal limits  CBC - Abnormal; Notable for the following components:   WBC 12.0 (*)    RBC 5.12 (*)    Hemoglobin 11.3 (*)    HCT 35.6 (*)    MCV 69.5 (*)    MCH 22.1 (*)    RDW 17.7 (*)    Platelets 413 (*)    All other components within normal limits  URINALYSIS, ROUTINE W REFLEX MICROSCOPIC - Abnormal; Notable for the following components:   pH 9.0 (*)    All other components within normal limits         Imaging Studies ordered: I ordered imaging studies including hip x-ray I independently visualized and interpreted imaging. I agree with the radiologist interpretation   Medicines ordered and prescription drug management: Meds ordered this encounter  Medications   HYDROcodone -acetaminophen  (NORCO/VICODIN) 5-325 MG per tablet 1 tablet    Refill:  0   ketorolac  (TORADOL ) 15 MG/ML injection 15 mg   dexamethasone  (DECADRON ) injection 10 mg   lidocaine (LIDODERM) 5 % 1 patch    -I have reviewed the patients home medicines and have made adjustments as needed  Critical interventions none    Cardiac Monitoring: The patient was maintained on a cardiac monitor.  I  personally viewed and interpreted the cardiac monitored which showed an underlying rhythm of: NSR  Social Determinants of Health:  Factors impacting patients care include: none   Reevaluation: After the interventions noted above, I reevaluated the patient and found that they have :improved  Co morbidities that complicate the patient evaluation  Past Medical History:  Diagnosis Date   Asthma       Dispostion: I considered admission for this patient, but at this time she does not meet inpatient criteria  for admission and will be discharged with outpatient follow-up.     Final Clinical Impression(s) / ED Diagnoses Final diagnoses:  None     @PCDICTATION @    Karlyn Overman, MD 01/19/24 2143

## 2024-01-19 NOTE — ED Provider Triage Note (Signed)
 Emergency Medicine Provider Triage Evaluation Note  Sandra Carr , a 68 y.o. female  was evaluated in triage.  Pt complains of right hip pain and spasms.  This past Wednesday.  Also endorsing spasm and tingling down the right leg.  Denies abdominal or flank pain.  Also states she has had some tingling in her right shoulder and hand as well.  States that strength and sensation are intact in upper and lower extremities.  Still ambulatory but there is pain in the right hip when she walks.  Review of Systems  Positive: See above Negative: See above  Physical Exam  BP (!) 144/79   Pulse 88   Temp 98.3 F (36.8 C) (Oral)   Resp 20   LMP 05/18/1993   SpO2 100%  Gen:   Awake, no distress   Resp:  Normal effort  MSK:   Moves extremities without difficulty  Other:    Medical Decision Making  Medically screening exam initiated at 9:24 AM.  Appropriate orders placed.  KEYLAN MECHAM was informed that the remainder of the evaluation will be completed by another provider, this initial triage assessment does not replace that evaluation, and the importance of remaining in the ED until their evaluation is complete.  Work up started   Janalee Mcmurray, PA-C 01/19/24 [phone]

## 2024-03-17 ENCOUNTER — Encounter: Payer: Self-pay | Admitting: Podiatry

## 2024-03-17 ENCOUNTER — Ambulatory Visit: Admitting: Podiatry

## 2024-03-17 DIAGNOSIS — M5431 Sciatica, right side: Secondary | ICD-10-CM | POA: Diagnosis not present

## 2024-03-17 NOTE — Progress Notes (Signed)
       Chief Complaint  Patient presents with   Numbness    bil numbness, tingling, very painful    HPI: 68 y.o. female presents today with complaint of right lower extremity numbness with burning and tingling sensations.  She reports pain going to the first toe which goes up the leg.  Right side is worse than left.  She reports first noticing this in May after she was hospitalized.  She does report some low back pain.  She reports that she does have upcoming appoint with neurologist.  Past Medical History:  Diagnosis Date   Asthma     Past Surgical History:  Procedure Laterality Date   ABDOMINAL HYSTERECTOMY     FOOT SURGERY Left     Allergies  Allergen Reactions   Other Hives and Anaphylaxis   Peanut-Containing Drug Products Shortness Of Breath   Contrast Media [Iodinated Contrast Media]    Iohexol      Code: HIVES, Desc: patient claims hive from iv contrast. she drank omnipaque laced water oral contrast before informing anyone of an allergy  (no problem from the oral at this time). no record of any enhancable procedures done in our system., Onset Date: [id number]    Latex Swelling   Methocarbamol  Swelling   Shellfish Allergy  Other (See Comments)    Mouth and Throat Swelling.   Triamcinolone Other (See Comments)   Hydrochlorothiazide Rash    ROS    Physical Exam: There were no vitals filed for this visit.  General: The patient is alert and oriented x3 in no acute distress.  Dermatology: Skin is warm, dry and supple bilateral lower extremities. Interspaces are clear of maceration and debris.  Does have mycotic appearing toenails.  No evidence of ingrown toenail.  Vascular: Palpable pedal pulses bilaterally. Capillary refill within normal limits.  No appreciable edema.  No erythema or calor.  Neurological: Light touch sensation grossly intact bilateral feet.  Pain with single-leg rise right side.  This does radiate to the back.  Protective sensation intact.  Mildly  decreased vibratory sensation.  Musculoskeletal Exam: No pedal deformities noted.  No symptomatic limitations in pedal range of motion.  Assessment/Plan of Care: 1. Sciatica of right side      No orders of the defined types were placed in this encounter.  None  Discussed clinical findings with patient today.  Findings seem consistent with sciatica versus radiculopathy.  She does have upcoming referral with neurologist later in the month.  Did discuss use of good supportive shoe gear and orthotics which may help alleviate some back pain to a degree.  Also discussed home rehabilitation exercises for sciatica pain with written instructions dispensed.  Will hold off on medication at this time, she may follow-up as needed if symptoms worsen.   Quartez Lagos L. Lamount MAUL, AACFAS [facility]     [address] [place], [place] [address]                Office [phone]  Fax [phone]

## 2024-04-01 ENCOUNTER — Other Ambulatory Visit: Payer: Self-pay | Admitting: Obstetrics and Gynecology

## 2024-04-01 DIAGNOSIS — Z1231 Encounter for screening mammogram for malignant neoplasm of breast: Secondary | ICD-10-CM

## 2024-04-02 ENCOUNTER — Ambulatory Visit: Admitting: Podiatry

## 2024-05-04 ENCOUNTER — Ambulatory Visit
Admission: RE | Admit: 2024-05-04 | Discharge: 2024-05-04 | Disposition: A | Discharge status: Home / Self Care | Source: Ambulatory Visit | Attending: Obstetrics and Gynecology | Admitting: Obstetrics and Gynecology

## 2024-05-04 DIAGNOSIS — Z1231 Encounter for screening mammogram for malignant neoplasm of breast: Secondary | ICD-10-CM

## 2024-05-14 ENCOUNTER — Other Ambulatory Visit: Payer: Self-pay | Admitting: Obstetrics and Gynecology

## 2024-05-14 DIAGNOSIS — R928 Other abnormal and inconclusive findings on diagnostic imaging of breast: Secondary | ICD-10-CM

## 2024-05-18 IMAGING — DX DG CHEST 2V
2 series · 2 of 2 positions shown · non-contrast
Comparison: Chest x-ray 02/06/2021

CLINICAL DATA: Shortness of breath.

EXAM:
CHEST - 2 VIEW

[chest pa]
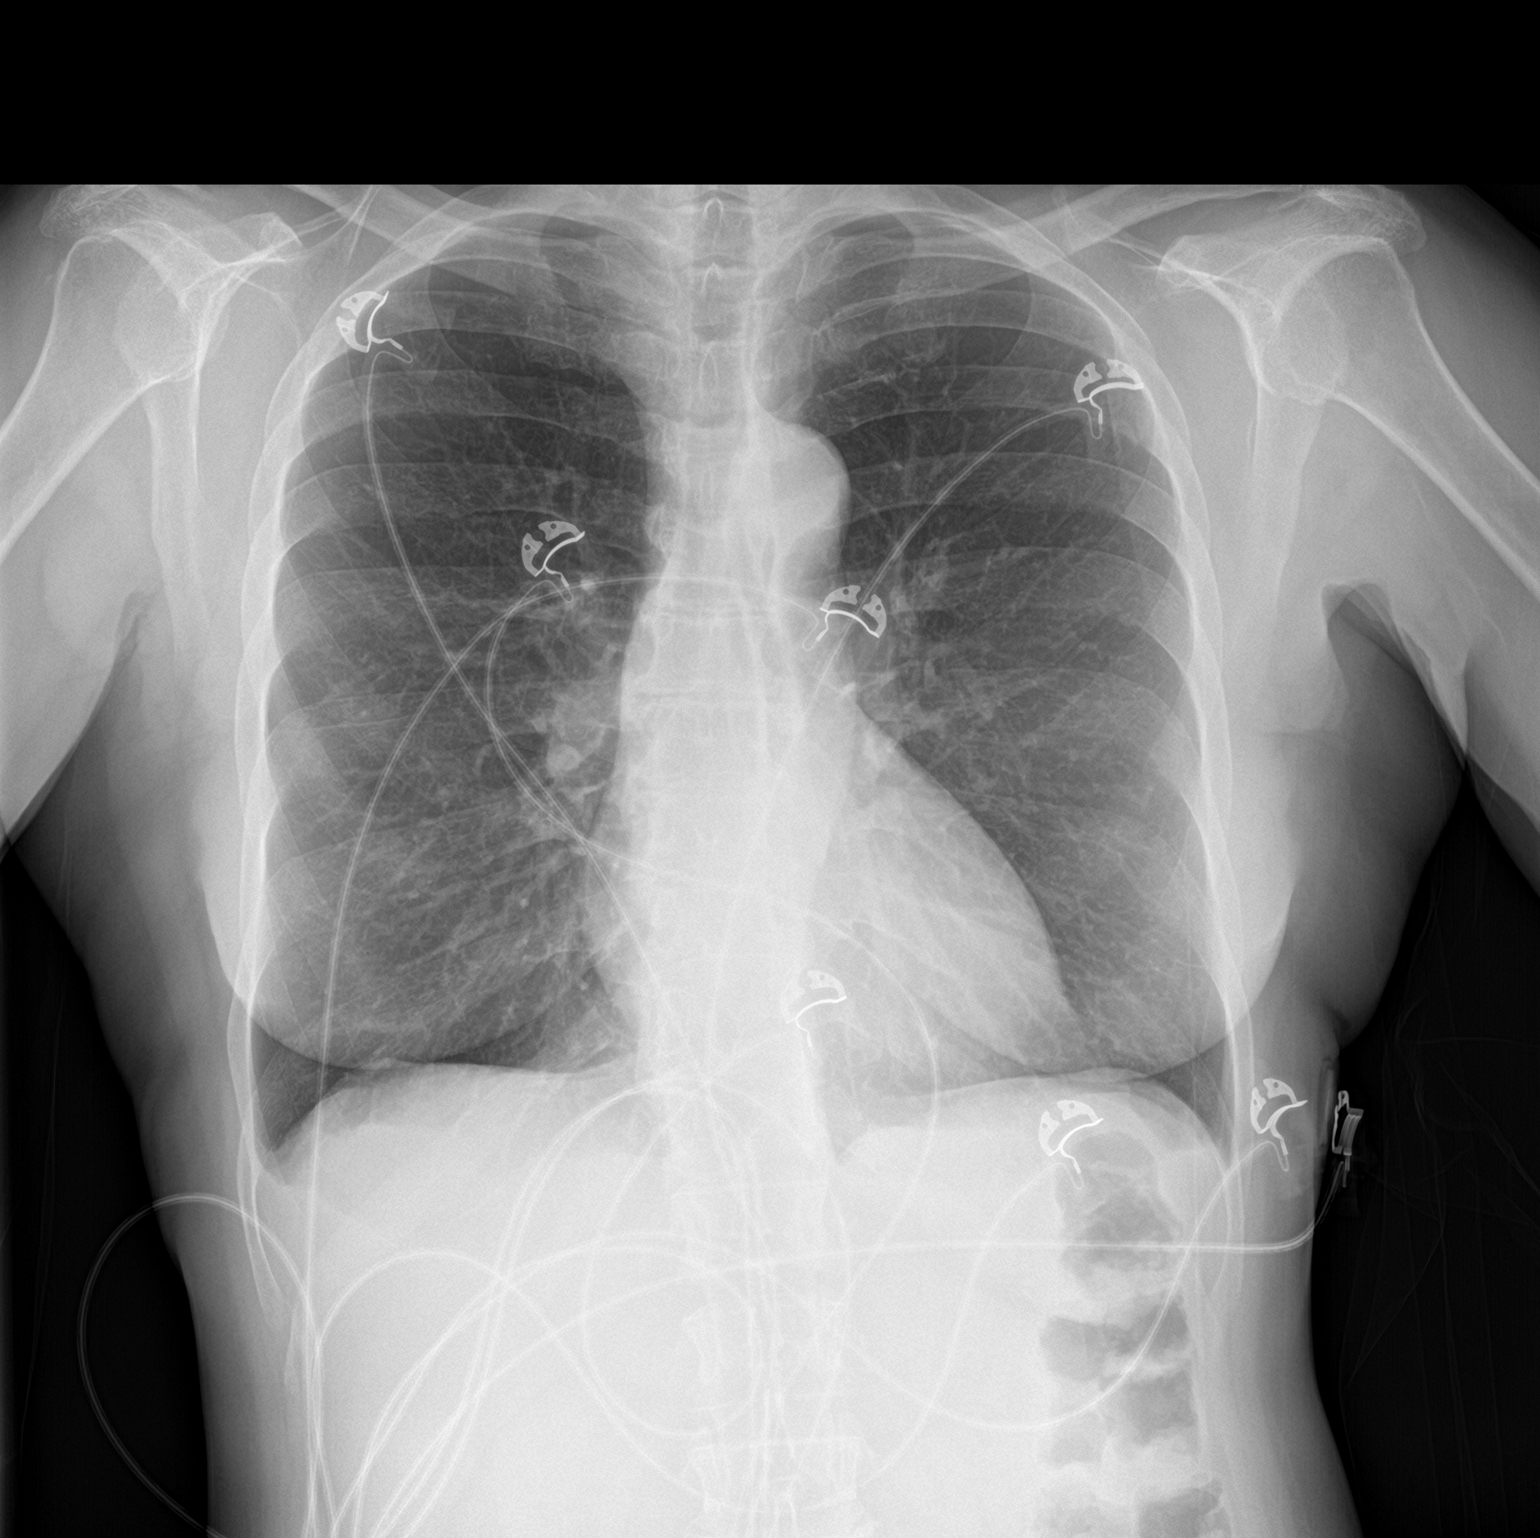

[chest lat]
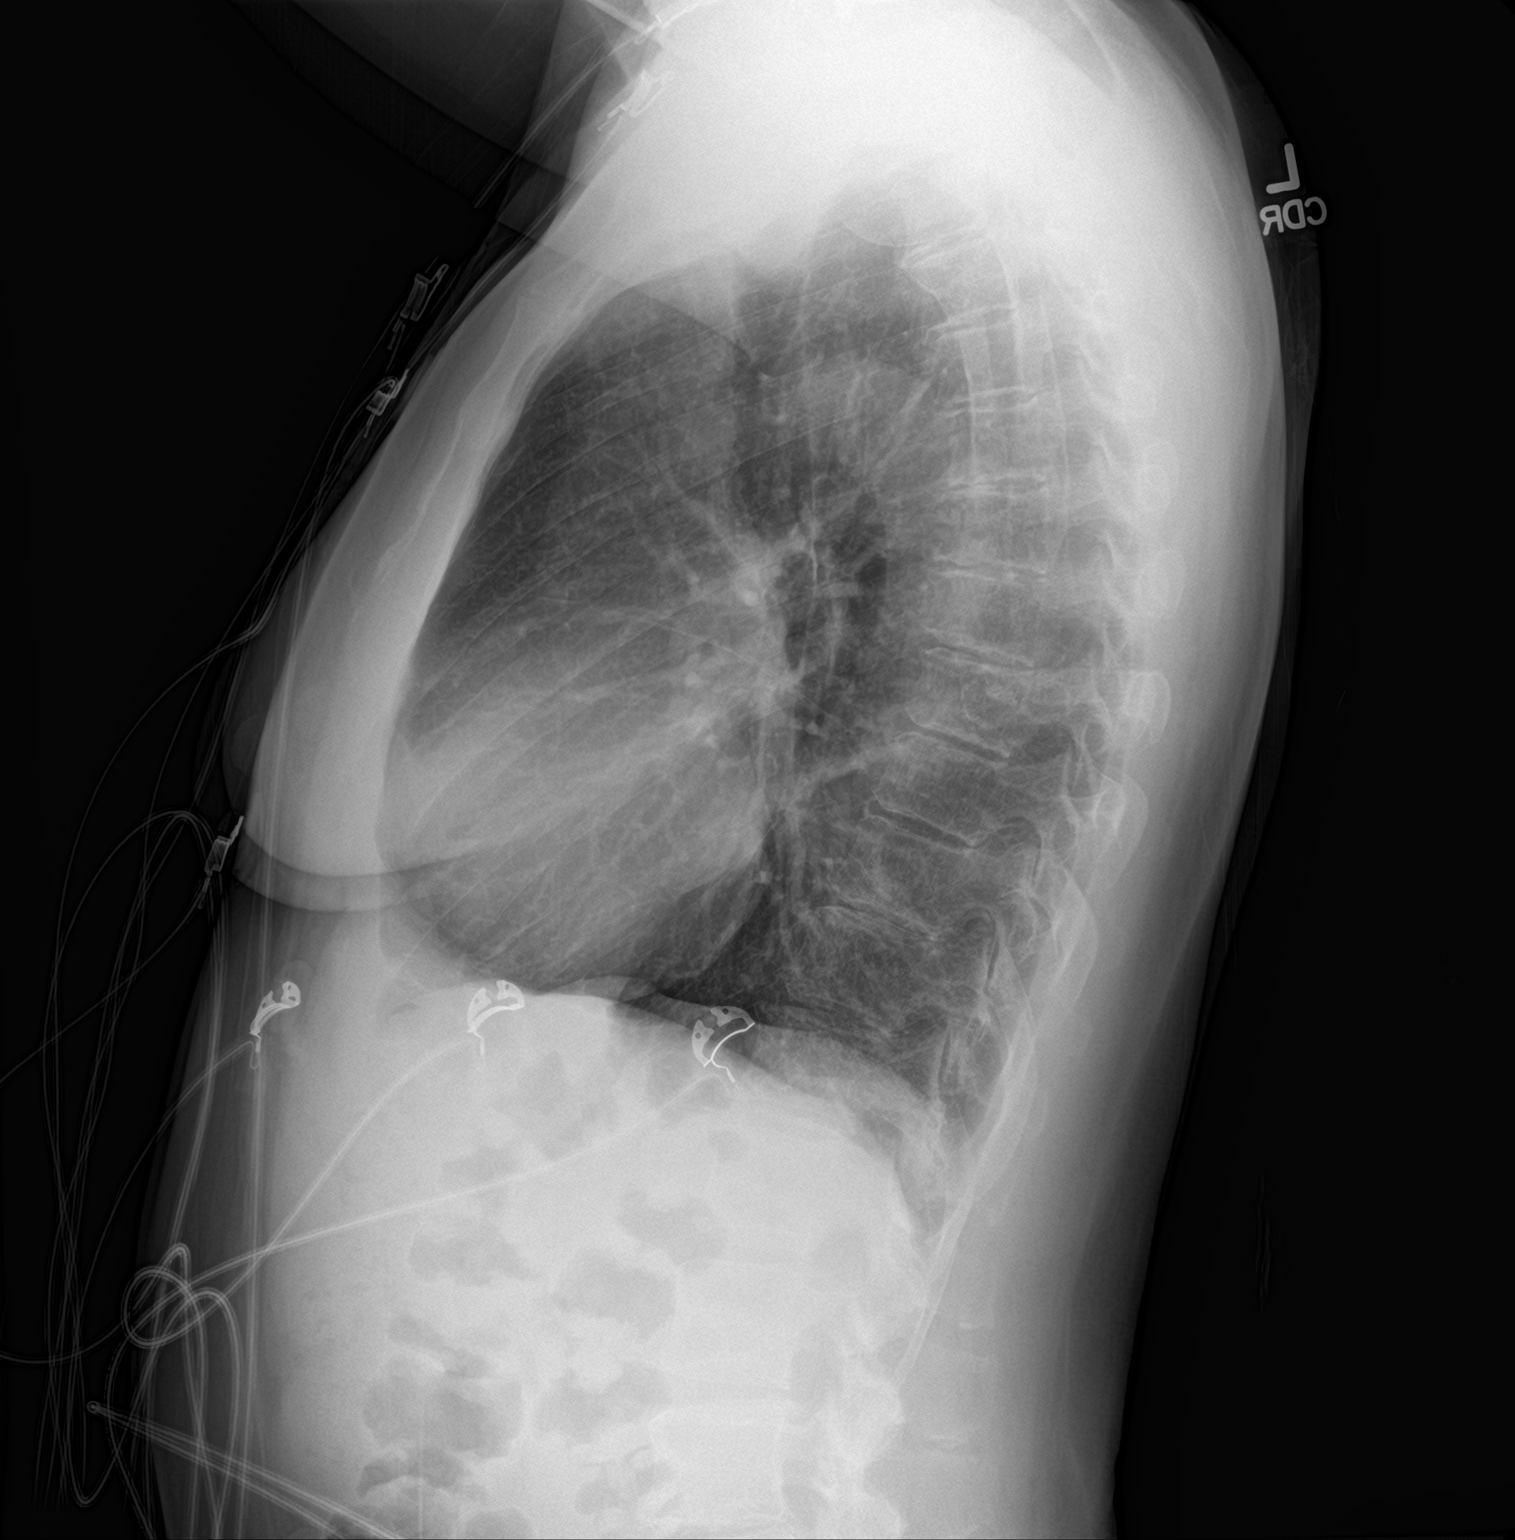

[2 of 2 positions shown; findings below may reference images not displayed]

FINDINGS: The heart size and mediastinal contours are within normal limits.
Both lungs are clear. Cervical spinal fusion plate is present. No
acute fractures are seen.
IMPRESSION: No active cardiopulmonary disease.

## 2024-05-21 ENCOUNTER — Ambulatory Visit
Admission: RE | Admit: 2024-05-21 | Discharge: 2024-05-21 | Disposition: A | Discharge status: Home / Self Care | Source: Ambulatory Visit | Attending: Obstetrics and Gynecology | Admitting: Obstetrics and Gynecology

## 2024-05-21 ENCOUNTER — Ambulatory Visit

## 2024-05-21 DIAGNOSIS — R928 Other abnormal and inconclusive findings on diagnostic imaging of breast: Secondary | ICD-10-CM

## 2024-07-07 ENCOUNTER — Other Ambulatory Visit: Payer: Self-pay

## 2024-07-07 ENCOUNTER — Ambulatory Visit

## 2024-07-07 DIAGNOSIS — M6281 Muscle weakness (generalized): Secondary | ICD-10-CM

## 2024-07-07 DIAGNOSIS — M5459 Other low back pain: Secondary | ICD-10-CM

## 2024-07-07 NOTE — Therapy (Signed)
 Patient Name: Sandra Carr MRN: [id number] DOB:1956-08-19, 68 y.o., female Today's Date: 07/07/2024  Pt was seen today for physical therapy evaluation for lumbar radiculopathy. Patient reported that she is currently getting physical therapy on her neck with [facility]. Patient was recommended that she continue her lower back therapy with the current therapists for continuity of the care. Patient has not shared her lumbar pain with her current therapist. I printed patient's referral and handed it to her. Patient will follow up with her current PT to add on lumbar radiculopathy to her current POC for neck pain.  Pt verbalized understanding.    Raj LOISE Blanch, PT 07/07/2024, 12:09 PM

## 2024-07-30 NOTE — Progress Notes (Signed)
 Pt attended 06/12/2024 screening event with BP of 128/78. Pt noted at event that she does have a PCP. At event pt did not indicate any SDOH needs. Pt also noted that she is not a smoker and listed Medicare as her insurance at the event.   Per initial f/u pt was reached out via phone and was left vm. Pt was mailed [facility] flyer & multiple community primary care clinics flyer.   Per chart review pt does have a PCP (no [facility]-visible visits & unable to verify), insurance, and is not a smoker. Pt does not indicate any SDOH needs at this time.  Additional pt f/u to be scheduled at this time per health equity protocol.

## 2024-08-13 ENCOUNTER — Other Ambulatory Visit (HOSPITAL_COMMUNITY): Payer: Self-pay | Admitting: Family Medicine

## 2024-08-13 DIAGNOSIS — R7989 Other specified abnormal findings of blood chemistry: Secondary | ICD-10-CM

## 2024-08-17 ENCOUNTER — Ambulatory Visit (HOSPITAL_COMMUNITY): Admission: RE | Admit: 2024-08-17

## 2024-08-17 MED ORDER — IOHEXOL 350 MG/ML SOLN
75.0000 mL | Freq: Once | INTRAVENOUS | Status: AC | PRN
  Administered 2024-08-17: 75 mL via INTRAVENOUS

## 2024-09-30 NOTE — Progress Notes (Signed)
 Per initial f/u, Pt attended 06/12/2024 screening event with BP of 128/78. Pt noted at event that she does have a PCP. At event pt did not indicate any SDOH needs. Pt also noted that she is not a smoker and listed Medicare as her insurance at the event.   Per initial f/u pt was reached out via phone and was left vm. Pt was mailed [facility] flyer & multiple community primary care clinics flyer.   Per chart review pt does have a PCP (no [facility]-visible visits & unable to verify), insurance, and is not a smoker. Pt does not indicate any SDOH needs at this time.  Additional pt f/u to be scheduled at this time per health equity protocol.  During the 60 day f/u interval,the pt does not have any new [facility] visible appts with her listed PCP.   60 Day Call Attempt: [facility] called pt to obtain PCP status. [facility] was unable to reach pt and left a vm.   [facility]W sent letter with [facility] and [facility] PCP Flyers. In case needed by the pt.   An additional follow up will be done at a later date per the Health Equity Teams protocol.

## 2024-10-13 ENCOUNTER — Ambulatory Visit: Admitting: Podiatry

## 2024-10-13 ENCOUNTER — Encounter: Payer: Self-pay | Admitting: Podiatry

## 2024-10-13 DIAGNOSIS — M5431 Sciatica, right side: Secondary | ICD-10-CM

## 2024-10-13 DIAGNOSIS — B351 Tinea unguium: Secondary | ICD-10-CM

## 2024-10-13 NOTE — Progress Notes (Signed)
"  °  Subjective:  Patient ID: Sandra Carr, female    DOB: [birth year],   MRN: [id number]  Chief Complaint  Patient presents with   Nail Problem    I need my toenails clipped, the right big toe is tender on the side.   Foot Pain    I have some numbness and tingling in my feet and hands.    69 y.o. female presents for tingling in her feet and hands.  She relates this has been ongoing for some time.  She is currently in therapy to help with lower back pains she has not noticed much of a difference at length.  She is also requesting to have her nails trimmed today. Denies any other pedal complaints. Denies n/v/f/c.   Past Medical History:  Diagnosis Date   Asthma     Objective:  Physical Exam: Vascular: DP/PT pulses 2/4 bilateral. CFT <3 seconds. Normal hair growth on digits. No edema.  Skin. No lacerations or abrasions bilateral feet.  Nails 1 through 5 bilateral thickened discolored and dystrophic with subungual debris. Musculoskeletal: MMT 5/5 bilateral lower extremities in DF, PF, Inversion and Eversion. Deceased ROM in DF of ankle joint.  No major pain to palpation.  No obvious deformities noted. Neurological: Sensation intact to light touch.  Protective sensation intact.  Assessment:   1. Pain due to onychomycosis of toenails of both feet   2. Sciatica of right side      Plan:  Patient was evaluated and treated and all questions answered. -ABN signed. 10/13/24 -Discussed and educated patient on  foot care, especially with  regards to the vascular, neurological and musculoskeletal systems.  -Mechanically debrided all nails 1-5 bilateral using sterile nail nipper and filed with dremel without incident  -Discussed with patient radiculopathy sciatic type nerve pain.  Discussed treatment options. -Gabapentin and other oral medications.  Patient defers at this time. -Discussed topical options like lidocaine  creams patient will give this a try. -With therapy for her back as this  likely is the cause and hopefully with time this will improve her numbness and tingling in her feet. -Answered all patient questions -Patient to return as needed -Patient advised to call the office if any problems or questions arise in the meantime.   Asberry Failing, DPM    "

## 2025-01-11 ENCOUNTER — Ambulatory Visit: Admitting: Podiatry
